# Patient Record
Sex: Male | Born: 1940 | Race: White | Hispanic: No | Marital: Married | Smoking: Former smoker
Health system: Southern US, Community
[De-identification: ages and names within clinical notes are randomized; demographics above are authoritative.]

## PROBLEM LIST (undated history)

## (undated) DIAGNOSIS — I10 Essential (primary) hypertension: Secondary | ICD-10-CM

## (undated) DIAGNOSIS — E119 Type 2 diabetes mellitus without complications: Secondary | ICD-10-CM

## (undated) DIAGNOSIS — J449 Chronic obstructive pulmonary disease, unspecified: Secondary | ICD-10-CM

## (undated) DIAGNOSIS — E785 Hyperlipidemia, unspecified: Secondary | ICD-10-CM

## (undated) HISTORY — DX: Hyperlipidemia, unspecified: E78.5

## (undated) HISTORY — DX: Type 2 diabetes mellitus without complications: E11.9

## (undated) HISTORY — PX: CORONARY ARTERY BYPASS GRAFT: SHX141

## (undated) HISTORY — DX: Essential (primary) hypertension: I10

## (undated) HISTORY — DX: Chronic obstructive pulmonary disease, unspecified: J44.9

---

## 2014-05-01 ENCOUNTER — Other Ambulatory Visit: Payer: Self-pay | Admitting: Family Medicine

## 2014-05-01 ENCOUNTER — Ambulatory Visit
Admission: RE | Admit: 2014-05-01 | Discharge: 2014-05-01 | Disposition: A | Discharge status: Home / Self Care | Payer: Medicare Other | Source: Ambulatory Visit | Attending: Family Medicine | Admitting: Family Medicine

## 2014-05-01 DIAGNOSIS — M545 Low back pain: Secondary | ICD-10-CM

## 2014-05-01 DIAGNOSIS — R0609 Other forms of dyspnea: Principal | ICD-10-CM

## 2014-05-01 DIAGNOSIS — M79606 Pain in leg, unspecified: Secondary | ICD-10-CM

## 2014-08-15 DIAGNOSIS — I2510 Atherosclerotic heart disease of native coronary artery without angina pectoris: Secondary | ICD-10-CM | POA: Insufficient documentation

## 2014-08-15 DIAGNOSIS — Z951 Presence of aortocoronary bypass graft: Secondary | ICD-10-CM | POA: Insufficient documentation

## 2015-03-18 ENCOUNTER — Encounter: Payer: Medicare Other | Attending: Family Medicine | Admitting: Dietician

## 2015-03-18 ENCOUNTER — Encounter: Payer: Self-pay | Admitting: Dietician

## 2015-03-18 VITALS — Ht 68.0 in | Wt 179.0 lb

## 2015-03-18 DIAGNOSIS — E119 Type 2 diabetes mellitus without complications: Secondary | ICD-10-CM | POA: Insufficient documentation

## 2015-03-18 DIAGNOSIS — Z7984 Long term (current) use of oral hypoglycemic drugs: Secondary | ICD-10-CM | POA: Diagnosis not present

## 2015-03-18 DIAGNOSIS — I10 Essential (primary) hypertension: Secondary | ICD-10-CM | POA: Insufficient documentation

## 2015-03-18 DIAGNOSIS — E118 Type 2 diabetes mellitus with unspecified complications: Secondary | ICD-10-CM

## 2015-03-18 NOTE — Patient Instructions (Signed)
Plan:  Aim for 3 Carb Choices per meal (45 grams) +/- 1 either way  Aim for 0-1 Carbs per snack if hungry  Include protein in moderation with your meals and snacks Consider reading food labels for Total Carbohydrate and Fat Grams of foods Consider  increasing your activity level by walking for 10 minutes daily as tolerated Consider checking BG at alternate times per day as directed by MD  Consider taking medication as directed by MD Avoid eating when not hungry. Avoid eating in front of the TV. Put the portions for a snack in a bowl/plate, sit down, and enjoy. Great job reducing your portions!

## 2015-03-18 NOTE — Addendum Note (Signed)
Addended by: Bonnita Levan on: 03/18/2015 05:01 PM   Modules accepted: Medications

## 2015-03-18 NOTE — Progress Notes (Signed)
Diabetes Self-Management Education  Visit Type: First/Initial  Appt. Start Time: 1045 Appt. End Time: 1215  03/18/2015  [name], identified by name and date of birth, is a 75 y.o. male with a diagnosis of Diabetes: Type 2. Other hx includes COPD, HTN, CABG and iron deficient anemia of unknown cause.  Patient also reports taking increased amounts of tums.  He has refused colonoscopy and endoscopy at this time.  Discussed briefly.  He also reports depression.  He reports decreased energy as his main complaint and has little interest in doing things.  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  He states that the job was very stressful.  Wife does the shopping and cooking.  He does not enjoy exercise but will care for his own lawn.  ASSESSMENT  Height  (1.727 m), weight 179 lb (81.194 kg). Body mass index is 27.22 kg/(m^2).      Diabetes Self-Management Education - 03/18/15 1106    Visit Information   Visit Type First/Initial   Initial Visit   Diabetes Type Type 2   Are you currently following a meal plan? No   Are you taking your medications as prescribed? Yes   Date Diagnosed 2002   Health Coping   How would you rate your overall health? Good   Psychosocial Assessment   Patient Belief/Attitude about Diabetes Other (comment)  "I hate taking the pills all the time."   Self-care barriers None   Self-management support Doctor's office;Family   Other persons present Patient;Spouse/SO   Patient Concerns Nutrition/Meal planning;Glycemic Control;Weight Control   Special Needs None   Preferred Learning Style No preference indicated   Learning Readiness Contemplating   How often do you need to have someone help you when you read instructions, pamphlets, or other written materials from your doctor or pharmacy? 1 - Never   What is the last grade level you completed in school? 4 years college   Complications   Last HgB A1C per  patient/outside source 8.3 %  01/2015   How often do you check your blood sugar? 1-2 times/day   Fasting Blood glucose range (mg/dL) 16-109;604-540   Postprandial Blood glucose range (mg/dL) >981   Number of hypoglycemic episodes per month 0   Number of hyperglycemic episodes per week 4   Can you tell when your blood sugar is high? Yes   What do you do if your blood sugar is high? don't   Have you had a dilated eye exam in the past 12 months? No   Have you had a dental exam in the past 12 months? No   Are you checking your feet? Yes   How many days per week are you checking your feet? 4   Dietary Intake   Breakfast plain Cheerios with splenda and 2% milk OR scrambled eggs, 2 strips bacon, english muffin with butter and jelly once per week  9   Snack (morning) occasional Lances crackers or home made crackers and cheese, or lightly salted peanuts, carrots and chip dip   Lunch apple, chicken salad or ham, crackers OR leftovers  snacks from 10-3   Snack (afternoon) chocolate, same as am and none after 3:00   Dinner meat, pasta or rice or potato and vegetable, salad with thousand [employer] (evening) none   Beverage(s) black coffee, water, occasional wine   Exercise   Exercise Type ADL's   How many days per week to you  exercise? 0   How many minutes per day do you exercise? 0   Total minutes per week of exercise 0   Patient Education   Previous Diabetes Education No   Disease state  Definition of diabetes, type 1 and 2, and the diagnosis of diabetes   Nutrition management  Role of diet in the treatment of diabetes and the relationship between the three main macronutrients and blood glucose level;Food label reading, portion sizes and measuring food.;Meal options for control of blood glucose level and chronic complications.   Physical activity and exercise  Role of exercise on diabetes management, blood pressure control and cardiac health.;Identified with patient nutritional and/or  medication changes necessary with exercise.;Helped patient identify appropriate exercises in relation to his/her diabetes, diabetes complications and other health issue.   Medications Reviewed patients medication for diabetes, action, purpose, timing of dose and side effects.   Monitoring Identified appropriate SMBG and/or A1C goals.;Daily foot exams;Yearly dilated eye exam   Acute complications Taught treatment of hypoglycemia - the 15 rule.   Chronic complications Relationship between chronic complications and blood glucose control;Dental care;Retinopathy and reason for yearly dilated eye exams   Psychosocial adjustment Worked with patient to identify barriers to care and solutions;Role of stress on diabetes   Individualized Goals (developed by patient)   Nutrition General guidelines for healthy choices and portions discussed   Physical Activity Exercise 3-5 times per week;15 minutes per day   Medications take my medication as prescribed   Monitoring  test my blood glucose as discussed   Reducing Risk treat hypoglycemia with 15 grams of carbs if blood glucose less than /dL;do foot checks daily   Outcomes   Expected Outcomes Demonstrated interest in learning. Expect positive outcomes  but does not like making change    Future DMSE PRN   Program Status Completed      Individualized Plan for Diabetes Self-Management Training:   Learning Objective:  Patient will have a greater understanding of diabetes self-management. Patient education plan is to attend individual and/or group sessions per assessed needs and concerns.  Also discussed GERD and foods to avoid to try to improve symptoms.  Plan:   Patient Instructions  Plan:  Aim for 3 Carb Choices per meal (45 grams) +/- 1 either way  Aim for 0-1 Carbs per snack if hungry  Include protein in moderation with your meals and snacks Consider reading food labels for Total Carbohydrate and Fat Grams of foods Consider  increasing your  activity level by walking for 10 minutes daily as tolerated Consider checking BG at alternate times per day as directed by MD  Consider taking medication as directed by MD Avoid eating when not hungry. Avoid eating in front of the TV. Put the portions for a snack in a bowl/plate, sit down, and enjoy. Great job reducing your portions!    Expected Outcomes:  Demonstrated interest in learning. Expect positive outcomes (but does not like making change )  Education material provided: Living Well with Diabetes, Food label handouts, Meal plan card, My Plate and Snack sheet, GERD handout from [facility]  If problems or questions, patient to contact team via:  Phone and Email  Future DSME appointment: PRN

## 2016-09-08 DIAGNOSIS — H35371 Puckering of macula, right eye: Secondary | ICD-10-CM | POA: Insufficient documentation

## 2016-09-08 DIAGNOSIS — E119 Type 2 diabetes mellitus without complications: Secondary | ICD-10-CM | POA: Insufficient documentation

## 2016-09-08 DIAGNOSIS — Z961 Presence of intraocular lens: Secondary | ICD-10-CM | POA: Insufficient documentation

## 2016-12-18 DIAGNOSIS — E789 Disorder of lipoprotein metabolism, unspecified: Secondary | ICD-10-CM | POA: Insufficient documentation

## 2017-03-15 DIAGNOSIS — H2512 Age-related nuclear cataract, left eye: Secondary | ICD-10-CM | POA: Insufficient documentation

## 2017-03-15 DIAGNOSIS — H5203 Hypermetropia, bilateral: Secondary | ICD-10-CM | POA: Insufficient documentation

## 2017-12-21 DIAGNOSIS — J449 Chronic obstructive pulmonary disease, unspecified: Secondary | ICD-10-CM | POA: Insufficient documentation

## 2017-12-21 DIAGNOSIS — I10 Essential (primary) hypertension: Secondary | ICD-10-CM | POA: Insufficient documentation

## 2018-07-01 DIAGNOSIS — R0609 Other forms of dyspnea: Secondary | ICD-10-CM | POA: Insufficient documentation

## 2020-04-05 ENCOUNTER — Ambulatory Visit
Admission: RE | Admit: 2020-04-05 | Discharge: 2020-04-05 | Disposition: A | Discharge status: Home / Self Care | Payer: Medicare PPO | Source: Ambulatory Visit | Attending: Family Medicine | Admitting: Family Medicine

## 2020-04-05 ENCOUNTER — Other Ambulatory Visit: Payer: Self-pay | Admitting: Family Medicine

## 2020-04-05 ENCOUNTER — Other Ambulatory Visit: Payer: Self-pay

## 2020-04-05 DIAGNOSIS — R062 Wheezing: Secondary | ICD-10-CM

## 2020-04-05 DIAGNOSIS — R0602 Shortness of breath: Secondary | ICD-10-CM

## 2020-06-11 ENCOUNTER — Other Ambulatory Visit: Payer: Self-pay | Admitting: *Deleted

## 2020-06-11 ENCOUNTER — Encounter: Payer: Self-pay | Admitting: *Deleted

## 2020-06-11 DIAGNOSIS — I472 Ventricular tachycardia, unspecified: Secondary | ICD-10-CM

## 2020-06-11 DIAGNOSIS — I2510 Atherosclerotic heart disease of native coronary artery without angina pectoris: Secondary | ICD-10-CM

## 2020-06-16 ENCOUNTER — Ambulatory Visit (INDEPENDENT_AMBULATORY_CARE_PROVIDER_SITE_OTHER): Payer: Medicare PPO

## 2020-06-16 DIAGNOSIS — I2510 Atherosclerotic heart disease of native coronary artery without angina pectoris: Secondary | ICD-10-CM

## 2020-06-16 DIAGNOSIS — I472 Ventricular tachycardia, unspecified: Secondary | ICD-10-CM

## 2020-12-17 ENCOUNTER — Encounter (HOSPITAL_BASED_OUTPATIENT_CLINIC_OR_DEPARTMENT_OTHER): Payer: Self-pay | Admitting: Emergency Medicine

## 2020-12-17 ENCOUNTER — Other Ambulatory Visit: Payer: Self-pay

## 2020-12-17 ENCOUNTER — Emergency Department (HOSPITAL_BASED_OUTPATIENT_CLINIC_OR_DEPARTMENT_OTHER)
Admission: EM | Admit: 2020-12-17 | Discharge: 2020-12-17 | Disposition: A | Discharge status: Home / Self Care | Payer: Medicare PPO | Attending: Emergency Medicine | Admitting: Emergency Medicine

## 2020-12-17 ENCOUNTER — Emergency Department (HOSPITAL_BASED_OUTPATIENT_CLINIC_OR_DEPARTMENT_OTHER): Payer: Medicare PPO

## 2020-12-17 DIAGNOSIS — Z79899 Other long term (current) drug therapy: Secondary | ICD-10-CM | POA: Insufficient documentation

## 2020-12-17 DIAGNOSIS — S0003XA Contusion of scalp, initial encounter: Secondary | ICD-10-CM | POA: Insufficient documentation

## 2020-12-17 DIAGNOSIS — I10 Essential (primary) hypertension: Secondary | ICD-10-CM | POA: Insufficient documentation

## 2020-12-17 DIAGNOSIS — W01198A Fall on same level from slipping, tripping and stumbling with subsequent striking against other object, initial encounter: Secondary | ICD-10-CM | POA: Insufficient documentation

## 2020-12-17 DIAGNOSIS — S0990XA Unspecified injury of head, initial encounter: Secondary | ICD-10-CM | POA: Diagnosis present

## 2020-12-17 DIAGNOSIS — Z7984 Long term (current) use of oral hypoglycemic drugs: Secondary | ICD-10-CM | POA: Diagnosis not present

## 2020-12-17 DIAGNOSIS — J449 Chronic obstructive pulmonary disease, unspecified: Secondary | ICD-10-CM | POA: Diagnosis not present

## 2020-12-17 DIAGNOSIS — I480 Paroxysmal atrial fibrillation: Secondary | ICD-10-CM | POA: Diagnosis not present

## 2020-12-17 DIAGNOSIS — Z951 Presence of aortocoronary bypass graft: Secondary | ICD-10-CM | POA: Insufficient documentation

## 2020-12-17 DIAGNOSIS — E119 Type 2 diabetes mellitus without complications: Secondary | ICD-10-CM | POA: Insufficient documentation

## 2020-12-17 DIAGNOSIS — W19XXXA Unspecified fall, initial encounter: Secondary | ICD-10-CM

## 2020-12-17 DIAGNOSIS — Z7901 Long term (current) use of anticoagulants: Secondary | ICD-10-CM | POA: Insufficient documentation

## 2020-12-17 NOTE — ED Provider Notes (Signed)
[facility] EMERGENCY DEPARTMENT Provider Note   CSN: [id number] Arrival date & time: 12/17/20  1638     History Chief Complaint  Patient presents with   Marletta Lor    Keny Donald is a 80 y.o. male.  HPI     80yo male with history of SVT, CAD, htn, paroxysmal atrial fibrillation on eliquis, DM, CABG who presents with concern for fall and head injury.  Was trying to get something out of the car and then turned and lost balance falling from standing and hitting the back of his head.  Denies LOC. No significanat headache. No n/v/diarrhea, black or bloody stools, cough, fever, numbness/weakness.  Came to ED given fall on eliquis.  Denies other acute concerns or injuries from the fall.   Past Medical History:  Diagnosis Date   COPD (chronic obstructive pulmonary disease) (HCC)    Diabetes mellitus without complication (HCC)    Hyperlipidemia    Hypertension     There are no problems to display for this patient.   Past Surgical History:  Procedure Laterality Date   CORONARY ARTERY BYPASS GRAFT         No family history on file.     Home Medications Prior to Admission medications   Medication Sig Start Date End Date Taking? Authorizing Provider  atorvastatin (LIPITOR) 40 MG tablet Take 40 mg by mouth daily.    [provider]  carvedilol (COREG) 25 MG tablet Take 25 mg by mouth 2 (two) times daily with a meal. 1/2 twice per day    [provider]  ferrous sulfate 325 (65 FE) MG tablet Take 325 mg by mouth daily with breakfast.    [provider]  glipiZIDE (GLUCOTROL) 10 MG tablet Take 10 mg by mouth 3 (three) times daily before meals.    [provider]  metFORMIN (GLUCOPHAGE-XR) 500 MG 24 hr tablet Take 500 mg by mouth 2 (two) times daily with a meal.    [provider]  sitaGLIPtin (JANUVIA) 50 MG tablet Take 25 mg by mouth daily.    [provider]  torsemide (DEMADEX) 5 MG tablet Take 5 mg by mouth daily.     [provider]    Allergies    Patient has no known allergies.  Review of Systems   Review of Systems  Constitutional:  Negative for fever.  Eyes:  Negative for visual disturbance.  Respiratory:  Negative for cough and shortness of breath.   Cardiovascular:  Negative for chest pain.  Gastrointestinal:  Negative for abdominal pain, nausea and vomiting.  Genitourinary:  Negative for difficulty urinating.  Musculoskeletal:  Negative for back pain and neck stiffness.  Skin:  Negative for rash.  Neurological:  Negative for syncope and headaches.   Physical Exam Updated Vital Signs BP (!) 180/85   Pulse 69   Temp 98.2 F (36.8 C) (Oral)   Resp 17   Ht 5\' 7"  (1.702 m)   Wt 72.6 kg   SpO2 97%   BMI 25.06 kg/m   Physical Exam Vitals and nursing note reviewed.  Constitutional:      General: He is not in acute distress.    Appearance: He is well-developed. He is not diaphoretic.  HENT:     Head: Normocephalic.     Comments: Occipital hematoma, small abrasion Eyes:     Conjunctiva/sclera: Conjunctivae normal.  Cardiovascular:     Rate and Rhythm: Normal rate and regular rhythm.     Heart sounds: Normal heart  sounds. No murmur heard.   No friction rub. No gallop.  Pulmonary:     Effort: Pulmonary effort is normal. No respiratory distress.     Breath sounds: Normal breath sounds. No wheezing or rales.  Abdominal:     General: There is no distension.     Palpations: Abdomen is soft.     Tenderness: There is no abdominal tenderness. There is no guarding.  Musculoskeletal:        General: No tenderness.     Cervical back: Normal range of motion.  Skin:    General: Skin is warm and dry.  Neurological:     Mental Status: He is alert and oriented to person, place, and time.    ED Results / Procedures / Treatments   Labs (all labs ordered are listed, but only abnormal results are displayed) Labs Reviewed - No data to display  EKG None  Radiology CT Head Wo  Contrast  Result Date: 12/17/2020 CLINICAL DATA:  Head trauma EXAM: CT HEAD WITHOUT CONTRAST TECHNIQUE: Contiguous axial images were obtained from the base of the skull through the vertex without intravenous contrast. COMPARISON:  None available FINDINGS: Brain: No acute intracranial hemorrhage, mass effect, or herniation. No extra-axial fluid collections. No evidence of acute territorial infarct. No hydrocephalus. Moderate cortical volume loss. Patchy hypodensities in the periventricular and subcortical white matter, likely secondary to chronic microvascular ischemic changes. 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 Vascular: Calcified plaques in the carotid siphons and distal vertebral arteries Skull: Normal. Negative for fracture or focal lesion. Sinuses/Orbits: No acute finding. Other: None. IMPRESSION: Chronic changes as described with no acute intracranial process identified. Electronically Signed   By: Ofilia Neas M.D.   On: 12/17/2020 18:11   CT Cervical Spine Wo Contrast  Result Date: 12/17/2020 CLINICAL DATA:  Head trauma EXAM: CT CERVICAL SPINE WITHOUT CONTRAST TECHNIQUE: Multidetector CT imaging of the cervical spine was performed without intravenous contrast. Multiplanar CT image reconstructions were also generated. COMPARISON:  None. FINDINGS: Alignment: Minimal grade 1 anterolisthesis of C2 on C3 and C3 on C4, likely chronic. Skull base and vertebrae: No acute fracture. No primary bone lesion or focal pathologic process. Soft tissues and spinal canal: No prevertebral fluid or swelling. No visible canal hematoma. Disc levels: Moderate intervertebral disc height loss at C4-C5, C5-C6 and C6-C7 with associated endplate sclerosis. Multilevel uncovertebral spurring and dorsal endplate osteophytes most prominent at C4-C5 and C5-C6. Facet arthropathy throughout the cervical spine. Multilevel neural foraminal narrowing most significant at C4-C5 and C5-C6. Mild  spinal canal stenoses. Upper chest: No acute process identified. Other: None. IMPRESSION: No acute fracture or subluxation identified.  Degenerative changes. Electronically Signed   By: Ofilia Neas M.D.   On: 12/17/2020 18:09    Procedures Procedures   Medications Ordered in ED Medications - No data to display  ED Course  I have reviewed the triage vital signs and the nursing notes.  Pertinent labs & imaging results that were available during my care of the patient were reviewed by me and considered in my medical decision making (see chart for details).    MDM Rules/Calculators/A&P                            80yo male with history of SVT, CAD, htn, paroxysmal atrial fibrillation on eliquis, DM, CABG who presents with concern for fall and head injury.  Mechanical fall by history with no dizziness,  syncope or acute medical concerns. Denies concerns for other injuries. Given mechanism, age, signs of trauma, CT cervical spine also performed without acute findings. CT head done given on anticoagulation shows no evidence of intrcranial bleeding. Recommend continued monitoring, PCP follow up as needed. Patient discharged in stable condition with understanding of reasons to return.   Final Clinical Impression(s) / ED Diagnoses Final diagnoses:  Fall, initial encounter  Hematoma of occipital region of scalp    Rx / DC Orders ED Discharge Orders     None        Gareth Morgan, MD 12/18/20 1213

## 2020-12-17 NOTE — ED Triage Notes (Signed)
Fell 1 hour ago , from standing posiy=tion backward, landed on the concrete , occipital hematoma , skin abrasion . No bleeding noted. On Elaquis . Alert and oriented x 4 .

## 2021-01-24 ENCOUNTER — Other Ambulatory Visit (HOSPITAL_BASED_OUTPATIENT_CLINIC_OR_DEPARTMENT_OTHER): Payer: Self-pay | Admitting: Family Medicine

## 2021-01-24 DIAGNOSIS — R1110 Vomiting, unspecified: Secondary | ICD-10-CM

## 2021-01-30 ENCOUNTER — Other Ambulatory Visit: Payer: Self-pay

## 2021-01-30 ENCOUNTER — Ambulatory Visit (HOSPITAL_BASED_OUTPATIENT_CLINIC_OR_DEPARTMENT_OTHER)
Admission: RE | Admit: 2021-01-30 | Discharge: 2021-01-30 | Disposition: A | Discharge status: Home / Self Care | Payer: Medicare PPO | Source: Ambulatory Visit | Attending: Family Medicine | Admitting: Family Medicine

## 2021-01-30 DIAGNOSIS — R1110 Vomiting, unspecified: Secondary | ICD-10-CM | POA: Insufficient documentation

## 2021-04-13 LAB — EXTERNAL GENERIC LAB PROCEDURE: COLOGUARD: NEGATIVE

## 2021-04-13 LAB — COLOGUARD: COLOGUARD: NEGATIVE

## 2021-05-19 ENCOUNTER — Emergency Department (HOSPITAL_COMMUNITY): Payer: Medicare PPO

## 2021-05-19 ENCOUNTER — Emergency Department (HOSPITAL_COMMUNITY)
Admission: EM | Admit: 2021-05-19 | Discharge: 2021-05-19 | Disposition: A | Discharge status: Home / Self Care | Payer: Medicare PPO | Attending: Emergency Medicine | Admitting: Emergency Medicine

## 2021-05-19 DIAGNOSIS — R112 Nausea with vomiting, unspecified: Secondary | ICD-10-CM | POA: Insufficient documentation

## 2021-05-19 DIAGNOSIS — W19XXXA Unspecified fall, initial encounter: Secondary | ICD-10-CM | POA: Insufficient documentation

## 2021-05-19 DIAGNOSIS — R42 Dizziness and giddiness: Secondary | ICD-10-CM | POA: Diagnosis not present

## 2021-05-19 DIAGNOSIS — Z23 Encounter for immunization: Secondary | ICD-10-CM | POA: Insufficient documentation

## 2021-05-19 DIAGNOSIS — S81012A Laceration without foreign body, left knee, initial encounter: Secondary | ICD-10-CM | POA: Insufficient documentation

## 2021-05-19 DIAGNOSIS — E871 Hypo-osmolality and hyponatremia: Secondary | ICD-10-CM | POA: Insufficient documentation

## 2021-05-19 DIAGNOSIS — S0031XA Abrasion of nose, initial encounter: Secondary | ICD-10-CM | POA: Insufficient documentation

## 2021-05-19 DIAGNOSIS — S0992XA Unspecified injury of nose, initial encounter: Secondary | ICD-10-CM | POA: Diagnosis present

## 2021-05-19 LAB — CBC WITH DIFFERENTIAL/PLATELET
Abs Immature Granulocytes: 0.02 10*3/uL (ref 0.00–0.07)
Basophils Absolute: 0 10*3/uL (ref 0.0–0.1)
Basophils Relative: 0 %
Eosinophils Absolute: 0.1 10*3/uL (ref 0.0–0.5)
Eosinophils Relative: 2 %
HCT: 34.9 % — ABNORMAL LOW (ref 39.0–52.0)
Hemoglobin: 11.6 g/dL — ABNORMAL LOW (ref 13.0–17.0)
Immature Granulocytes: 0 %
Lymphocytes Relative: 8 %
Lymphs Abs: 0.4 10*3/uL — ABNORMAL LOW (ref 0.7–4.0)
MCH: 32.2 pg (ref 26.0–34.0)
MCHC: 33.2 g/dL (ref 30.0–36.0)
MCV: 96.9 fL (ref 80.0–100.0)
Monocytes Absolute: 0.5 10*3/uL (ref 0.1–1.0)
Monocytes Relative: 10 %
Neutro Abs: 4.3 10*3/uL (ref 1.7–7.7)
Neutrophils Relative %: 80 %
Platelets: 147 10*3/uL — ABNORMAL LOW (ref 150–400)
RBC: 3.6 MIL/uL — ABNORMAL LOW (ref 4.22–5.81)
RDW: 14.4 % (ref 11.5–15.5)
WBC: 5.3 10*3/uL (ref 4.0–10.5)
nRBC: 0 % (ref 0.0–0.2)

## 2021-05-19 LAB — COMPREHENSIVE METABOLIC PANEL
ALT: 15 U/L (ref 0–44)
AST: 27 U/L (ref 15–41)
Albumin: 3.5 g/dL (ref 3.5–5.0)
Alkaline Phosphatase: 55 U/L (ref 38–126)
Anion gap: 9 (ref 5–15)
BUN: 19 mg/dL (ref 8–23)
CO2: 23 mmol/L (ref 22–32)
Calcium: 8.7 mg/dL — ABNORMAL LOW (ref 8.9–10.3)
Chloride: 101 mmol/L (ref 98–111)
Creatinine, Ser: 1.48 mg/dL — ABNORMAL HIGH (ref 0.61–1.24)
GFR, Estimated: 47 mL/min — ABNORMAL LOW (ref >60)
Glucose, Bld: 124 mg/dL — ABNORMAL HIGH (ref 70–99)
Potassium: 4 mmol/L (ref 3.5–5.1)
Sodium: 133 mmol/L — ABNORMAL LOW (ref 135–145)
Total Bilirubin: 1 mg/dL (ref 0.3–1.2)
Total Protein: 6.6 g/dL (ref 6.5–8.1)

## 2021-05-19 LAB — MAGNESIUM: Magnesium: 1.8 mg/dL (ref 1.7–2.4)

## 2021-05-19 MED ORDER — ONDANSETRON 4 MG PO TBDP
ORAL_TABLET | ORAL | 0 refills | Status: AC
Start: 2021-05-19 — End: ?

## 2021-05-19 MED ORDER — TETANUS-DIPHTH-ACELL PERTUSSIS 5-2.5-18.5 LF-MCG/0.5 IM SUSY
0.5000 mL | PREFILLED_SYRINGE | Freq: Once | INTRAMUSCULAR | Status: AC
  Administered 2021-05-19: 0.5 mL via INTRAMUSCULAR
  Filled 2021-05-19: qty 0.5

## 2021-05-19 MED ORDER — SODIUM CHLORIDE 0.9 % IV BOLUS
1000.0000 mL | Freq: Once | INTRAVENOUS | Status: AC
  Administered 2021-05-19: 1000 mL via INTRAVENOUS

## 2021-05-19 NOTE — Progress Notes (Signed)
Orthopedic Tech Progress Note ?Patient Details:  ?Terrance Usery ?[birth year] ?[id number] ? ?Patient ID: Micael Barb, male   DOB: 03/21/1940, 81 y.o.   MRN: [id number] ?Level II; not needed, ? ?Darleen Crocker ?05/19/2021, 8:55 PM ? ?

## 2021-05-19 NOTE — ED Provider Notes (Signed)
?[facility] EMERGENCY DEPARTMENT ?Provider Note ? ? ?CSN: [id number] ?Arrival date & time: 05/19/21  2051 ? ?  ? ?History ? ?Chief Complaint  ?Patient presents with  ? Fall  ? ? ?George Fitzpatrick is a 81 y.o. male. ? ?George Fitzpatrick with a chief complaint of a fall.  The patient tells me that he was going down a incline and he lost his balance and fell onto his face.  He did have some nausea and vomiting afterwards.  Reportedly has had some diarrhea over the past week and is felt a little bit lightheaded when he stands up.  He scraped up his left knee but denies any other significant injury.  Denies chest pain abdominal pain back pain. ? ? ?Fall ? ? ?  ? ?Home Medications ?Prior to Admission medications   ?Medication Sig Start Date End Date Taking? Authorizing Provider  ?ondansetron (ZOFRAN-ODT) 4 MG disintegrating tablet 4mg  ODT q4 hours prn nausea/vomit 05/19/21  Yes 07/19/21, DO  ?atorvastatin (LIPITOR) 40 MG tablet Take 40 mg by mouth daily.    [provider]  ?carvedilol (COREG) 25 MG tablet Take 25 mg by mouth 2 (two) times daily with a meal. 1/2 twice per day    [provider]  ?ferrous sulfate 325 (65 FE) MG tablet Take 325 mg by mouth daily with breakfast.    [provider]  ?glipiZIDE (GLUCOTROL) 10 MG tablet Take 10 mg by mouth 3 (three) times daily before meals.    [provider]  ?metFORMIN (GLUCOPHAGE-XR) 500 MG 24 hr tablet Take 500 mg by mouth 2 (two) times daily with a meal.    [provider]  ?sitaGLIPtin (JANUVIA) 50 MG tablet Take 25 mg by mouth daily.    [provider]  ?torsemide (DEMADEX) 5 MG tablet Take 5 mg by mouth daily.    [provider]  ?   ? ?Allergies    ?Patient has no known allergies.   ? ?Review of Systems   ?Review of Systems ? ?Physical Exam ?Updated Vital Signs ?BP (!) 196/76   Pulse 68   Temp 98.4 ?F (36.9 ?C)   Resp 13   SpO2 99%  ?Physical Exam ?Vitals and nursing note reviewed.  ?Constitutional:    ?   Appearance: He is well-developed.  ?HENT:  ?   Head: Normocephalic.  ?   Comments: Abrasion to the bridge of the nose. ?Eyes:  ?   Pupils: Pupils are equal, round, and reactive to light.  ?Neck:  ?   Vascular: No JVD.  ?Cardiovascular:  ?   Rate and Rhythm: Normal rate and regular rhythm.  ?   Heart sounds: No murmur heard. ?  No friction rub. No gallop.  ?Pulmonary:  ?   Effort: No respiratory distress.  ?   Breath sounds: No wheezing.  ?Abdominal:  ?   General: There is no distension.  ?   Tenderness: There is no abdominal tenderness. There is no guarding or rebound.  ?Musculoskeletal:     ?   General: Normal range of motion.  ?   Cervical back: Normal range of motion and neck supple.  ?   Comments: Very small skin tear to the left knee.  Full range of motion of the left knee without pain.  Palpated from head to toe without any other noted areas of bony tenderness.  ?Skin: ?   Coloration: Skin is not pale.  ?   Findings: No rash.  ?Neurological:  ?  Mental Status: He is alert and oriented to person, place, and time.  ?Psychiatric:     ?   Behavior: Behavior normal.  ? ? ?ED Results / Procedures / Treatments   ?Labs ?(all labs ordered are listed, but only abnormal results are displayed) ?Labs Reviewed  ?CBC WITH DIFFERENTIAL/PLATELET - Abnormal; Notable for the following components:  ?    Result Value  ? RBC 3.60 (*)   ? Hemoglobin 11.6 (*)   ? HCT 34.9 (*)   ? Platelets 147 (*)   ? Lymphs Abs 0.4 (*)   ? All other components within normal limits  ?COMPREHENSIVE METABOLIC PANEL - Abnormal; Notable for the following components:  ? Sodium 133 (*)   ? Glucose, Bld 124 (*)   ? Creatinine, Ser 1.48 (*)   ? Calcium 8.7 (*)   ? GFR, Estimated 47 (*)   ? All other components within normal limits  ?MAGNESIUM  ? ? ?EKG ?None ? ?Radiology ?CT Head Wo Contrast ? ?Result Date: 05/19/2021 ?CLINICAL DATA:  Recent fall while on blood thinners, initial encounter EXAM: CT HEAD WITHOUT CONTRAST TECHNIQUE: Contiguous axial images  were obtained from the base of the skull through the vertex without intravenous contrast. RADIATION DOSE REDUCTION: This exam was performed according to the departmental dose-optimization program which includes automated exposure control, adjustment of the mA and/or kV according to patient size and/or use of iterative reconstruction technique. COMPARISON:  08/16/2020 FINDINGS: Brain: Mild atrophic changes and chronic white matter ischemic changes are seen similar to that noted on the prior exam. No acute hemorrhage, acute infarction or space-occupying mass lesion is noted. Vascular: No hyperdense vessel or unexpected calcification. Skull: Normal. Negative for fracture or focal lesion. Sinuses/Orbits: No acute finding. Other: None. IMPRESSION: Chronic atrophic and ischemic changes without acute abnormality. Electronically Signed   By: Alcide CleverMark  Lukens Fitzpatrick.D.   On: 05/19/2021 21:24   ? ?Procedures ?Procedures  ? ? ?Medications Ordered in ED ?Medications  ?sodium chloride 0.9 % bolus 1,000 mL (1,000 mLs Intravenous New Bag/Given 05/19/21 2126)  ?Tdap (BOOSTRIX) injection 0.5 mL (0.5 mLs Intramuscular Given 05/19/21 2124)  ? ? ?ED Course/ Medical Decision Making/ A&P ?  ?                        ?Medical Decision Making ?Amount and/or Complexity of Data Reviewed ?Labs: ordered. ?Radiology: ordered. ? ?Risk ?Prescription drug management. ? ? ?81 yo Fitzpatrick with a chief complaints of a fall.  He arrived as a level 2 trauma.  Patient had a nonsyncopal fall by history though had some complaints of possible dehydration and diarrhea so blood work was ordered.  He has a very trivial hyponatremia.  No significant electrolyte abnormality otherwise no significant anemia.  CT of the head is negative for acute intracranial abnormality.  I reviewed the patient's medical record and was unable to find history of tetanus will update today.  Will discharge home.  PCP follow-up. ? ?10:23 PM:  I have discussed the diagnosis/risks/treatment options with  the patient.  Evaluation and diagnostic testing in the emergency department does not suggest an emergent condition requiring admission or immediate intervention beyond what has been performed at this time.  They will follow up with  PCP. We also discussed returning to the ED immediately if new or worsening sx occur. We discussed the sx which are most concerning (e.g., sudden worsening pain, fever, inability to tolerate by mouth ) that necessitate immediate return. Medications administered to  the patient during their visit and any new prescriptions provided to the patient are listed below. ? ?Medications given during this visit ?Medications  ?sodium chloride 0.9 % bolus 1,000 mL (1,000 mLs Intravenous New Bag/Given 05/19/21 2126)  ?Tdap (BOOSTRIX) injection 0.5 mL (0.5 mLs Intramuscular Given 05/19/21 2124)  ? ? ? ?The patient appears reasonably screen and/or stabilized for discharge and I doubt any other medical condition or other [facility] requiring further screening, evaluation, or treatment in the ED at this time prior to discharge.  ? ? ? ? ? ? ? ? ?Final Clinical Impression(s) / ED Diagnoses ?Final diagnoses:  ?Fall, initial encounter  ?Nasal abrasion, initial encounter  ? ? ?Rx / DC Orders ?ED Discharge Orders   ? ?      Ordered  ?  ondansetron (ZOFRAN-ODT) 4 MG disintegrating tablet       ? 05/19/21 2221  ? ?  ?  ? ?  ? ? ?  ?Melene Plan, DO ?05/19/21 2223 ? ?

## 2021-05-19 NOTE — Discharge Instructions (Signed)
Follow up with your doctor in the office.  Return for worsening confusion, persistent vomiting ?

## 2021-05-19 NOTE — ED Triage Notes (Signed)
Pt presents as a level 2 trauma, fall on thinners. Arrives G[facility] 15, only obvious trauma is skin tear on nose.  ?

## 2021-05-19 NOTE — ED Notes (Signed)
Pt transported to CT by trauma nurse ?

## 2021-05-19 NOTE — ED Notes (Signed)
Trauma Response Nurse Documentation ? ? ?George Fitzpatrick is a 81 y.o. male arriving to [facility] ED via EMS ? ?On Eliquis (apixaban) daily for atrial fibrillation. Trauma was activated as a Level 2 by ED charge RN based on the following trauma criteria Elderly patients > 65 with head trauma on anti-coagulation (excluding ASA). Trauma RN at the bedside on patient arrival. Patient cleared for CT by Dr. Adela Lank EDP. Patient to CT with TRN. GCS 15. ? ?History  ? Past Medical History:  ?Diagnosis Date  ? COPD (chronic obstructive pulmonary disease) (HCC)   ? Diabetes mellitus without complication (HCC)   ? Hyperlipidemia   ? Hypertension   ?  ? Past Surgical History:  ?Procedure Laterality Date  ? CORONARY ARTERY BYPASS GRAFT    ?  ? ? ? ?Initial Focused Assessment (If applicable, or please see trauma documentation): ?Alert/oriented male presents via EMS, airway patent/unobstructed ?No uncontrolled hemorrhage, skin tear to nose and left knee with controlled bleeding ?IV established in the field, 20G L AC ?No c-collar in place, 4MG  zofran given PTA ? ?CT's Completed:   ?CT Head  ? ?Interventions:  ?Trauma lab draw ?No xrays per Dr. ?IV fluid bolus ?TDAP ? ?Plan for disposition:  ?Discharge home  ? ?Consults completed:  ?none  ? ?Event Summary: ?Patient arrives via EMS after a ground level fall onto dirt. Abrasion to nose and left knee. Bleeding controlled. No LOC, GCS 15. Reports briefly that he was unable to move, resolved independently. Vomiting PTA, 4MG  zofran given by EMS. Moves all four extremities on arrival and denies pain. Escorted to CT by myself. TDAP updated. D/C home. ? ?MTP Summary (If applicable): NA ? ?Bedside handoff with ED RN Mikayla.   ? ?Tekesha Almgren O Edahi Kroening  ?Trauma Response RN ? ?Please call TRN at [phone] for further assistance. ?  ?

## 2021-05-30 DIAGNOSIS — I4729 Other ventricular tachycardia: Secondary | ICD-10-CM | POA: Insufficient documentation

## 2022-05-12 ENCOUNTER — Other Ambulatory Visit (HOSPITAL_BASED_OUTPATIENT_CLINIC_OR_DEPARTMENT_OTHER): Payer: Self-pay

## 2022-05-12 MED ORDER — TRULICITY 1.5 MG/0.5ML ~~LOC~~ SOAJ
1.5000 mg | SUBCUTANEOUS | 3 refills | Status: DC
  Filled 2022-05-12: qty 2, 28d supply, fill #0
  Filled 2022-06-04 – 2022-06-05 (×2): qty 2, 28d supply, fill #1

## 2022-05-13 ENCOUNTER — Other Ambulatory Visit (HOSPITAL_BASED_OUTPATIENT_CLINIC_OR_DEPARTMENT_OTHER): Payer: Self-pay

## 2022-05-15 ENCOUNTER — Other Ambulatory Visit (HOSPITAL_BASED_OUTPATIENT_CLINIC_OR_DEPARTMENT_OTHER): Payer: Self-pay

## 2022-06-04 ENCOUNTER — Other Ambulatory Visit (HOSPITAL_BASED_OUTPATIENT_CLINIC_OR_DEPARTMENT_OTHER): Payer: Self-pay

## 2022-06-05 ENCOUNTER — Other Ambulatory Visit (HOSPITAL_BASED_OUTPATIENT_CLINIC_OR_DEPARTMENT_OTHER): Payer: Self-pay

## 2022-06-05 MED ORDER — FLUTICASONE-SALMETEROL 250-50 MCG/ACT IN AEPB
1.0000 | INHALATION_SPRAY | Freq: Two times a day (BID) | RESPIRATORY_TRACT | 3 refills | Status: AC
  Filled 2022-06-05 – 2022-06-11 (×3): qty 180, 90d supply, fill #0

## 2022-06-08 ENCOUNTER — Other Ambulatory Visit (HOSPITAL_BASED_OUTPATIENT_CLINIC_OR_DEPARTMENT_OTHER): Payer: Self-pay

## 2022-06-11 ENCOUNTER — Other Ambulatory Visit (HOSPITAL_BASED_OUTPATIENT_CLINIC_OR_DEPARTMENT_OTHER): Payer: Self-pay

## 2022-06-12 ENCOUNTER — Other Ambulatory Visit (HOSPITAL_BASED_OUTPATIENT_CLINIC_OR_DEPARTMENT_OTHER): Payer: Self-pay

## 2022-08-04 IMAGING — CT CT CERVICAL SPINE W/O CM
4 series · 15 of 33 positions shown, 18 images · non-contrast
Comparison: None.

CLINICAL DATA: Head trauma

EXAM:
CT CERVICAL SPINE WITHOUT CONTRAST
TECHNIQUE: Multidetector CT imaging of the cervical spine was performed without
intravenous contrast. Multiplanar CT image reconstructions were also
generated.

[Series 3: c_spine 2.0 i30s 3 · axial · 0.35mm/px · z∈[+1142,+1246]mm · 5 of 78 slices shown, 7 images]
[im 13/78  soft-tissue]
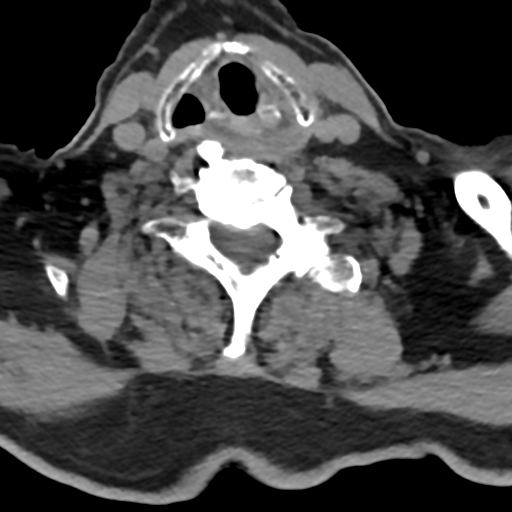
[im 13/78  bone]
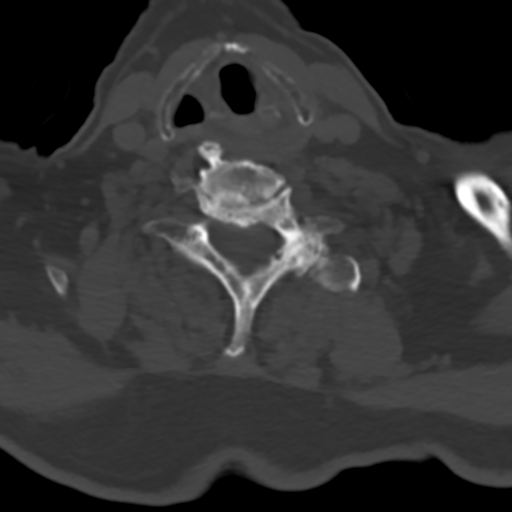
[im 26/78  bone]
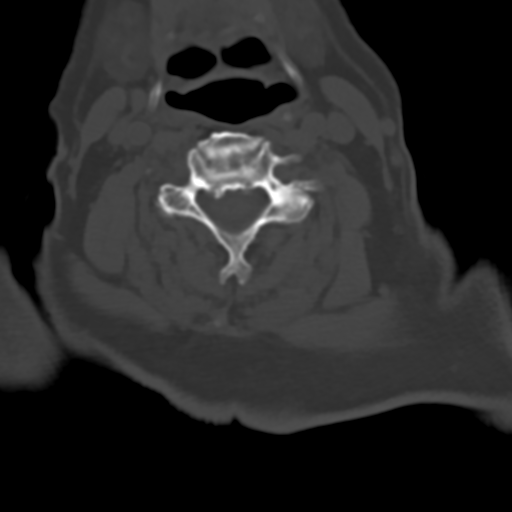
[im 39/78  bone]
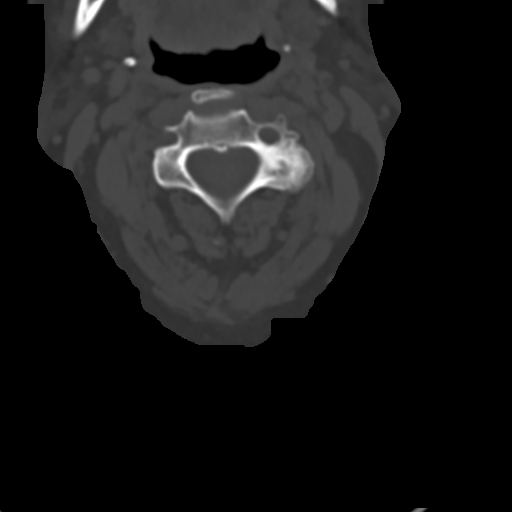
[im 52/78  bone]
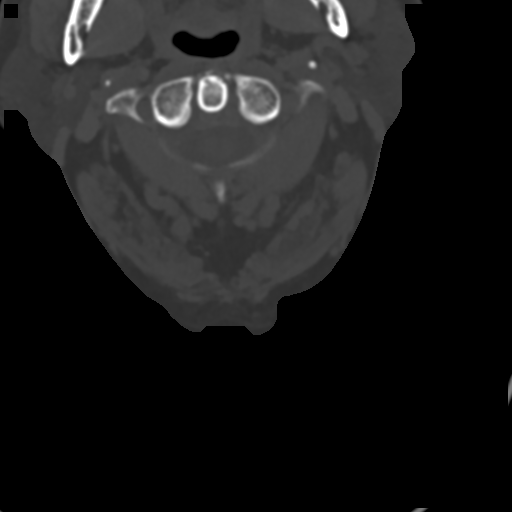
[im 65/78  soft-tissue]
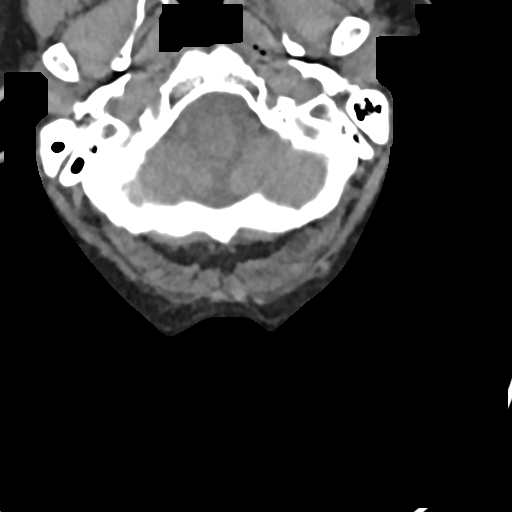
[im 65/78  bone]
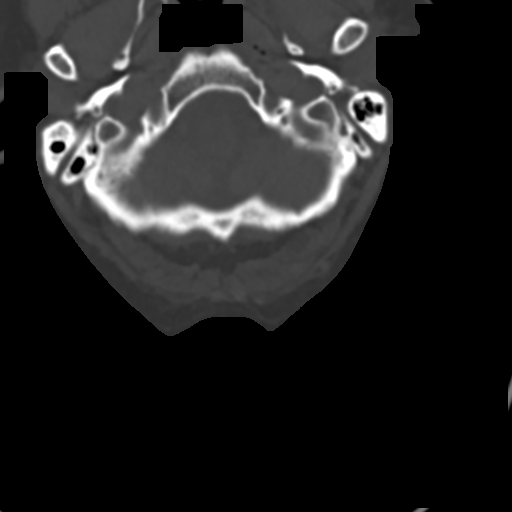

[Series 5: coronals · coronal · 0.23mm/px · 3 of 61 slices shown]
[im 14/61  bone]
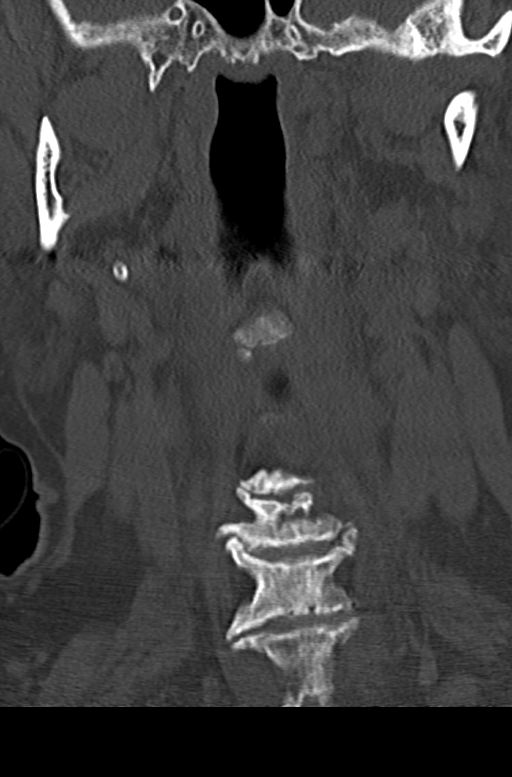
[im 25/61  bone]
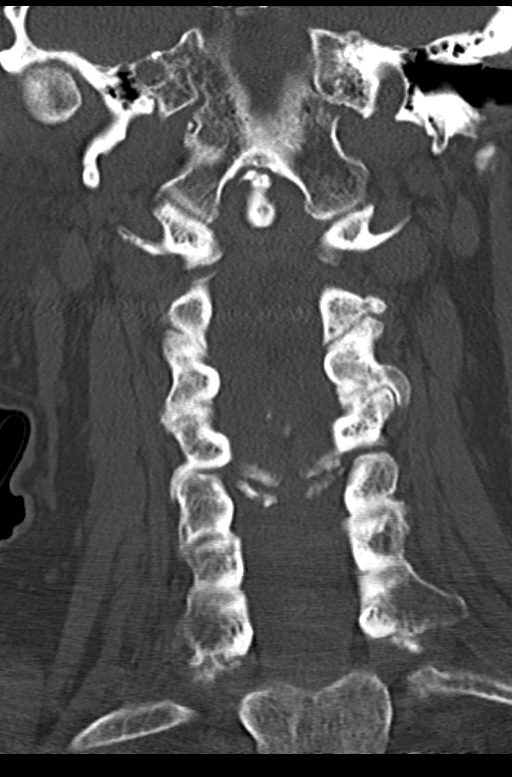
[im 36/61  bone]
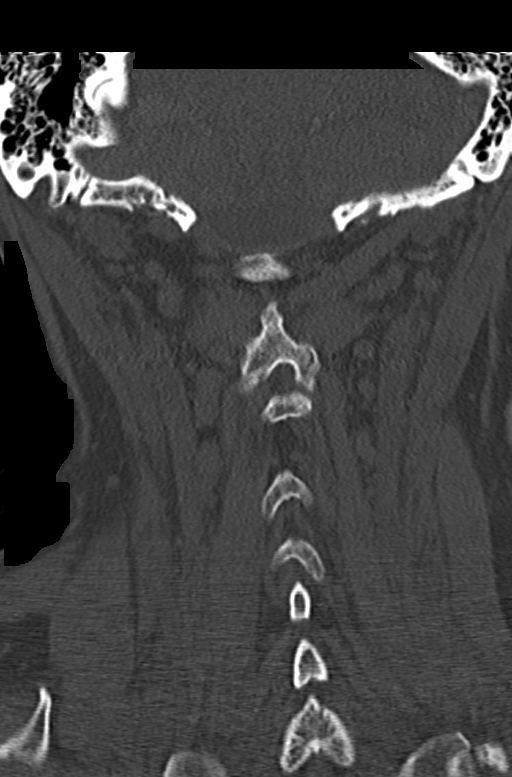

[Series 6: sagittals · sagittal · 0.29mm/px · 5 of 61 slices shown, 6 images]
[im 21/61  bone]
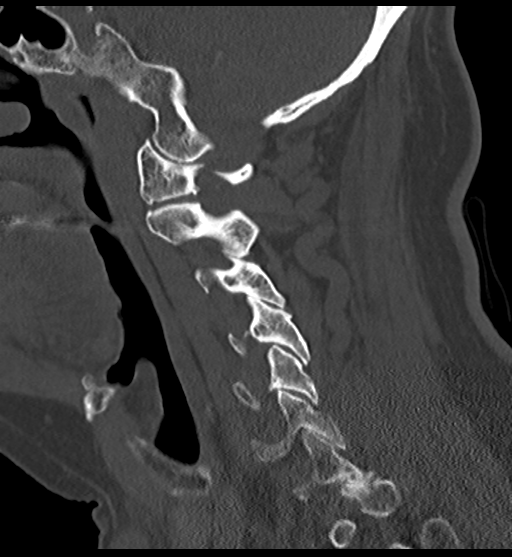
[im 26/61  bone]
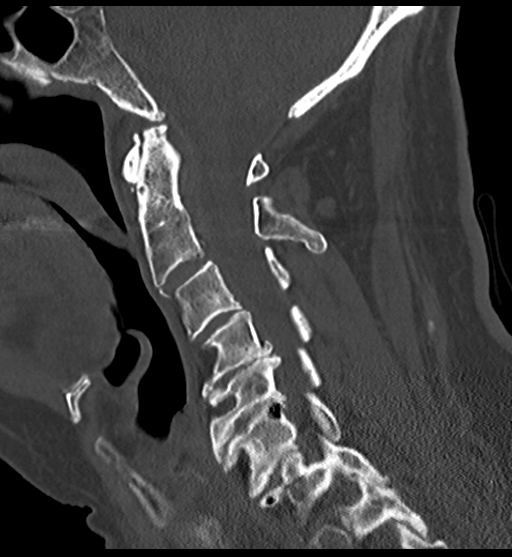
[im 31/61  soft-tissue]
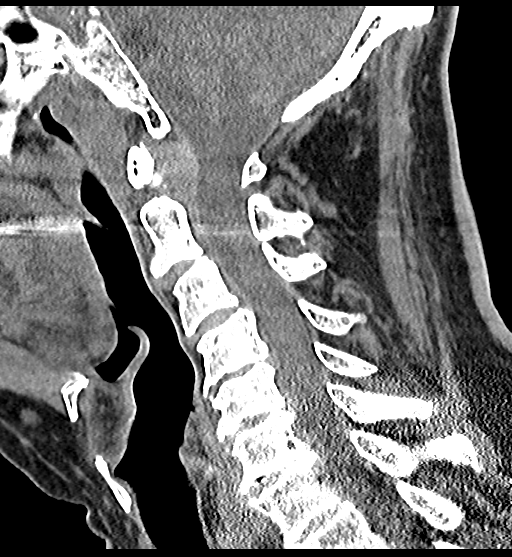
[im 31/61  bone]
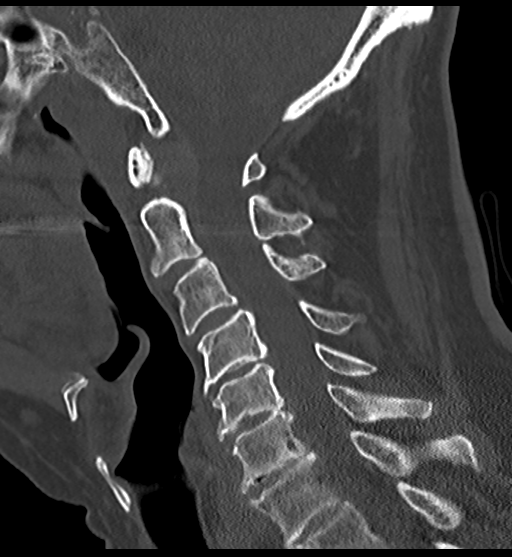
[im 36/61  bone]
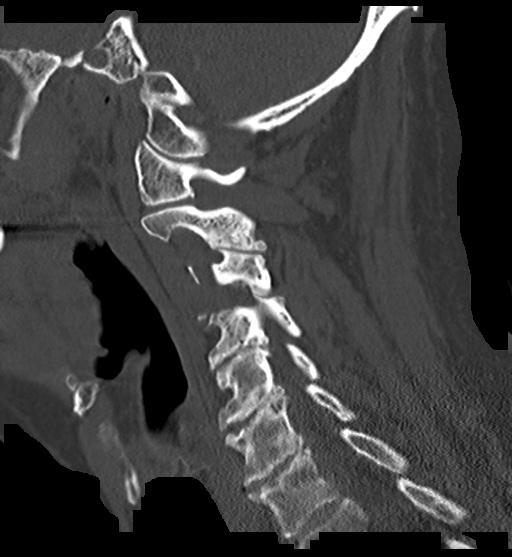
[im 41/61  bone]
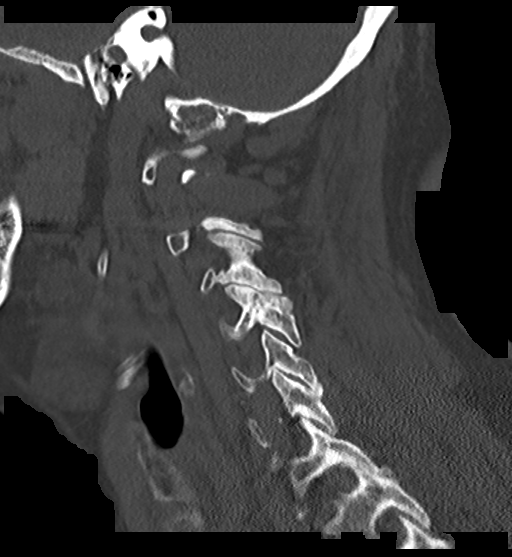

[Series 7: orthogonals · axial · 0.23mm/px · z∈[+1136,+1161]mm · 2 of 77 slices shown]
[im 13/77  bone]
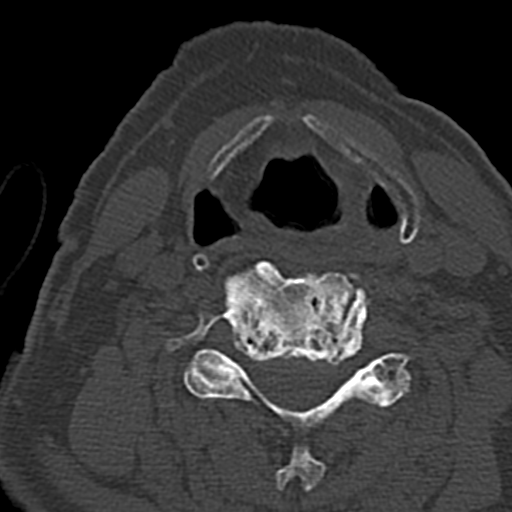
[im 26/77  bone]
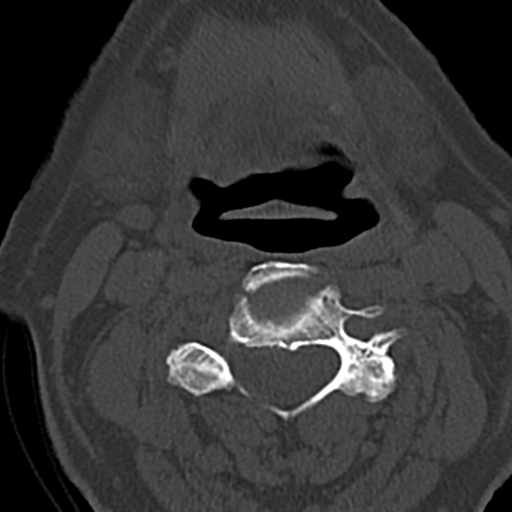

[15 of 33 positions shown; findings below may reference images not displayed]

FINDINGS: Alignment: Minimal grade 1 anterolisthesis of C2 on C3 and C3 on C4,
likely chronic.

Skull base and vertebrae: No acute fracture. No primary bone lesion
or focal pathologic process.

Soft tissues and spinal canal: No prevertebral fluid or swelling. No
visible canal hematoma.

Disc levels: Moderate intervertebral disc height loss at C4-C5,
C5-C6 and C6-C7 with associated endplate sclerosis. Multilevel
uncovertebral spurring and dorsal endplate osteophytes most
prominent at C4-C5 and C5-C6. Facet arthropathy throughout the
cervical spine. Multilevel neural foraminal narrowing most
significant at C4-C5 and C5-C6. Mild spinal canal stenoses.

Upper chest: No acute process identified.

Other: None.
IMPRESSION: No acute fracture or subluxation identified.  Degenerative changes.

## 2022-08-04 IMAGING — CT CT HEAD W/O CM
3 series · 14 of 47 positions shown, 16 images · non-contrast
Comparison: None available

CLINICAL DATA: Head trauma

EXAM:
CT HEAD WITHOUT CONTRAST
TECHNIQUE: Contiguous axial images were obtained from the base of the skull
through the vertex without intravenous contrast.

[Series 2: head 5.0 h30s · axial · 0.51mm/px · z∈[+1236,+1371]mm · 8 of 33 slices shown, 10 images]
[im 3/33  brain]
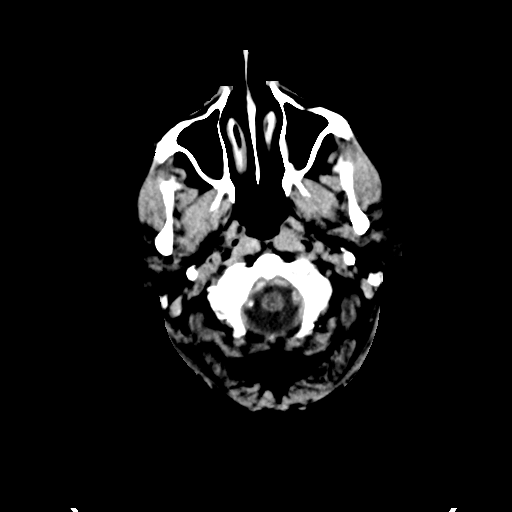
[im 3/33  bone]
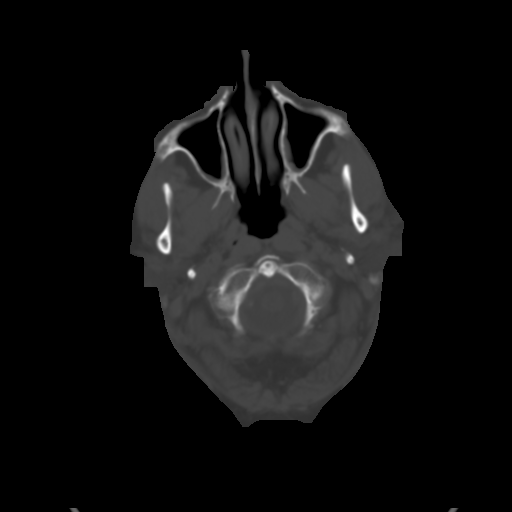
[im 7/33  brain]
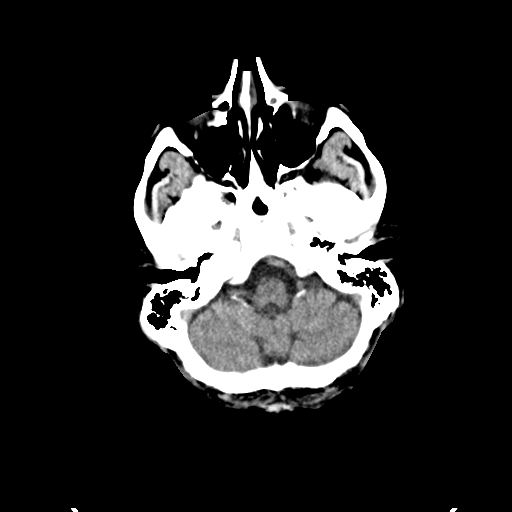
[im 10/33  brain]
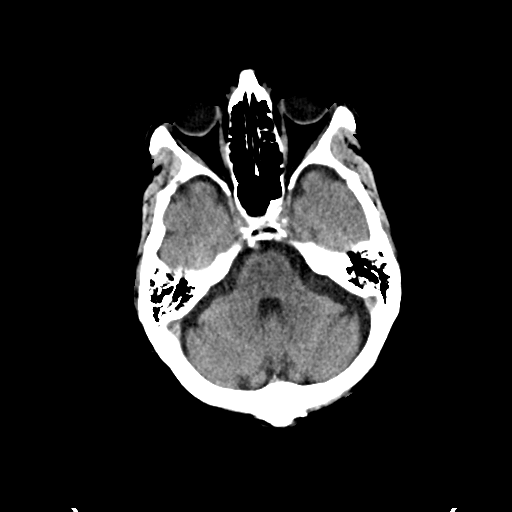
[im 15/33  brain]
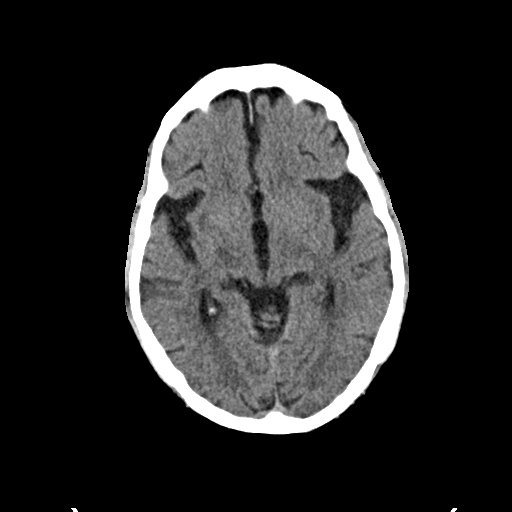
[im 18/33  brain]
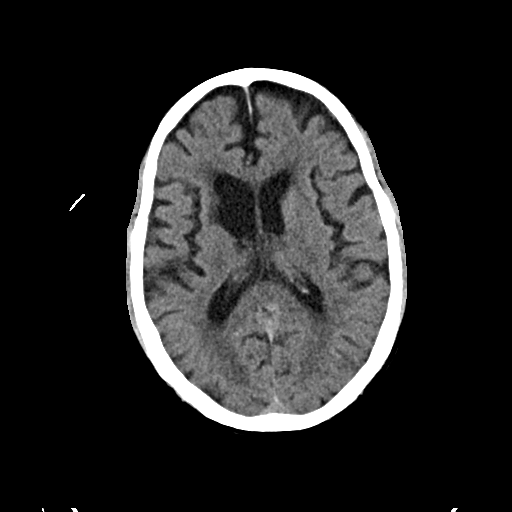
[im 18/33  bone]
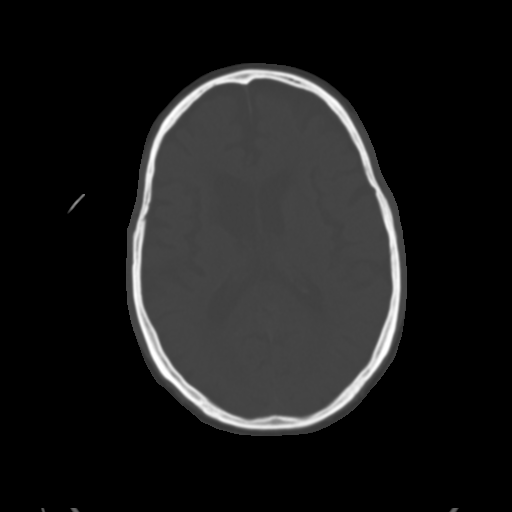
[im 23/33  brain]
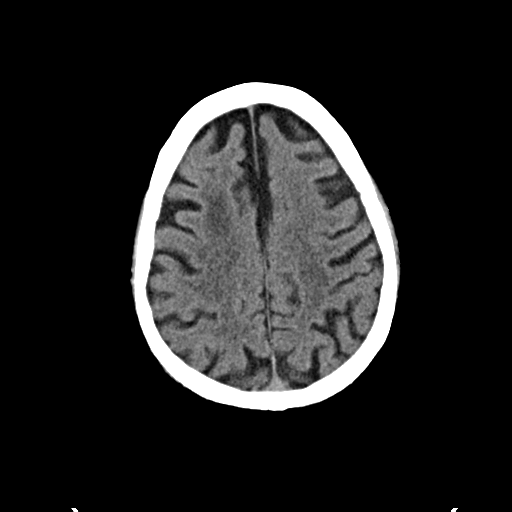
[im 26/33  brain]
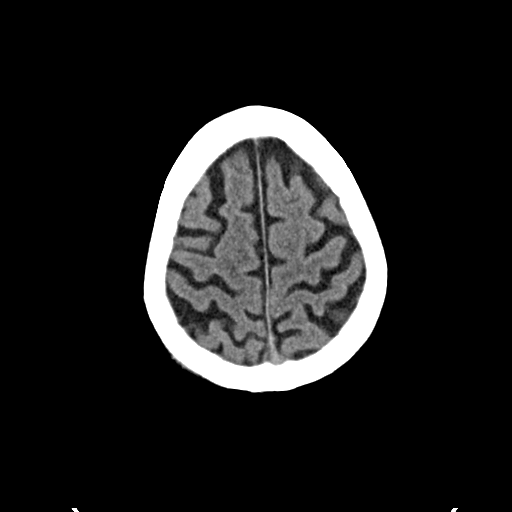
[im 30/33  brain]
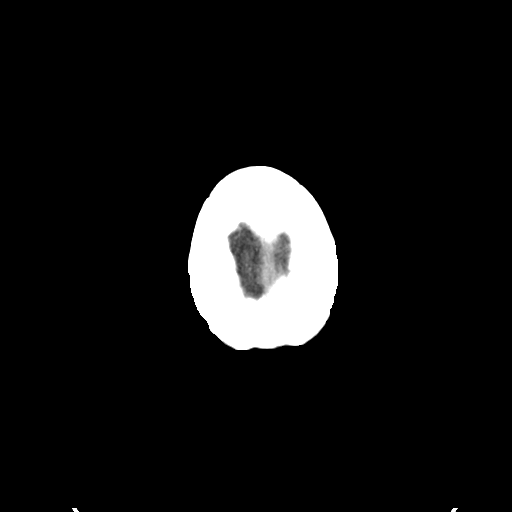

[Series 4: head 3.0 mpr cor · coronal · 0.33mm/px · 3 of 67 slices shown]
[im 23/67  brain]
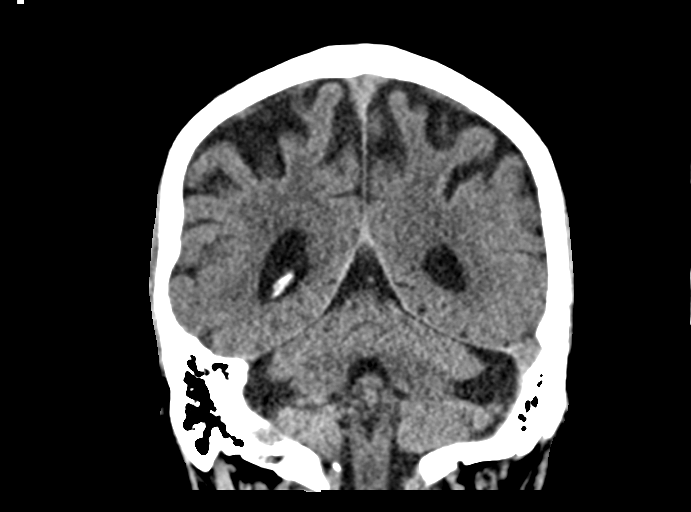
[im 30/67  brain]
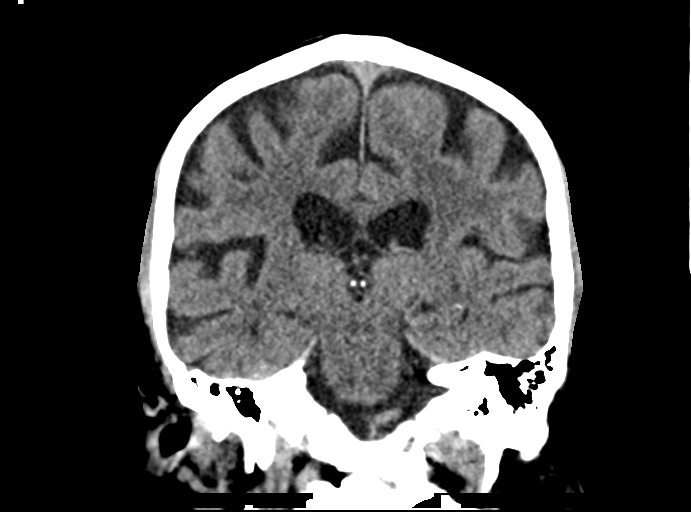
[im 37/67  brain]
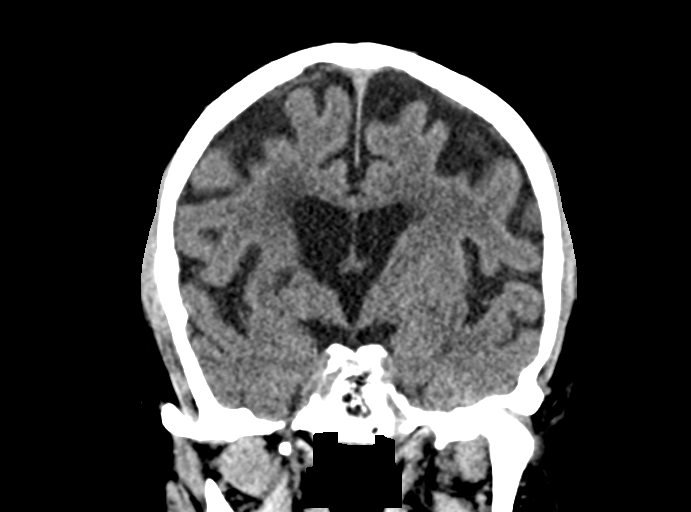

[Series 5: head 3.0 mpr sag · sagittal · 0.33mm/px · 3 of 66 slices shown]
[im 22/66  brain]
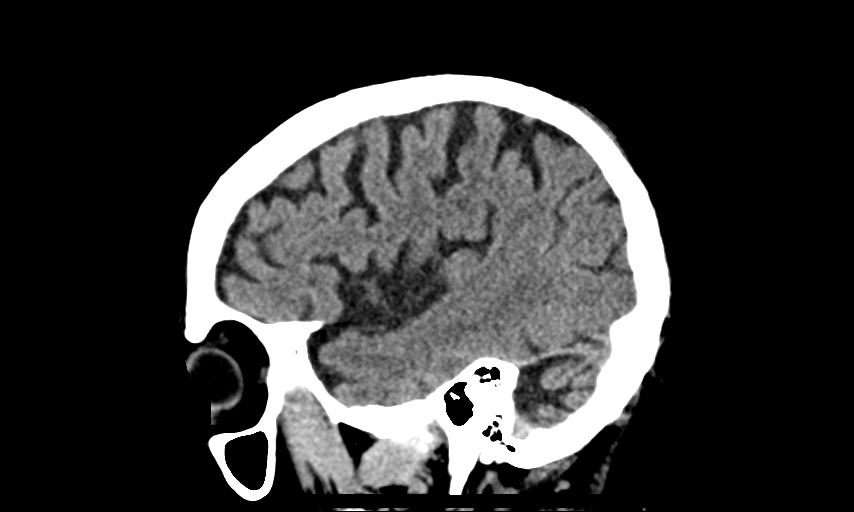
[im 33/66  brain]
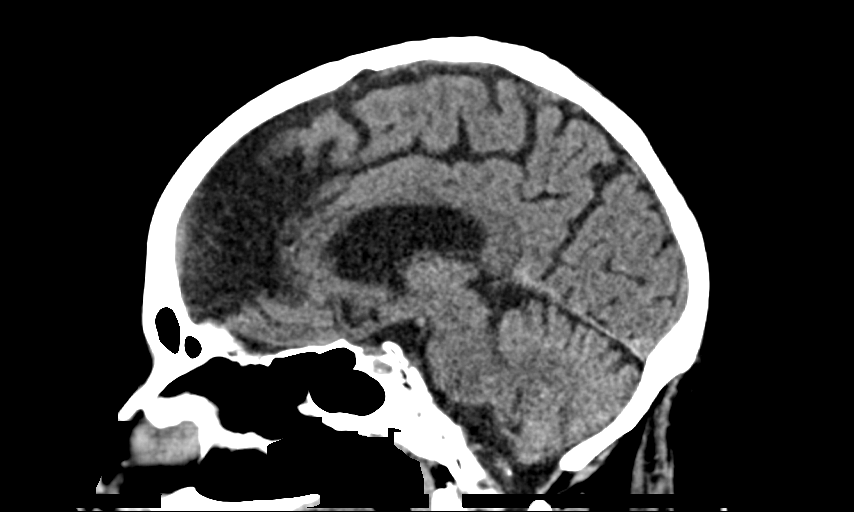
[im 44/66  brain]
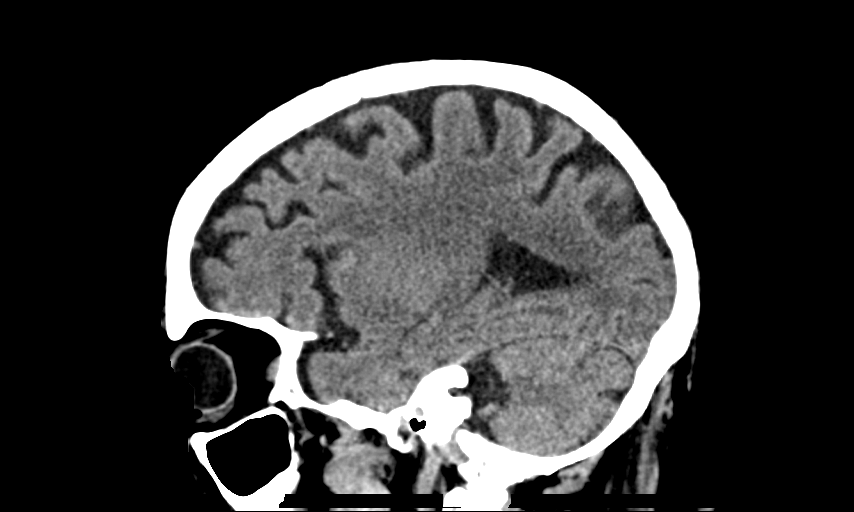

[14 of 47 positions shown; findings below may reference images not displayed]

FINDINGS: Brain: No acute intracranial hemorrhage, mass effect, or herniation.
No extra-axial fluid collections. No evidence of acute territorial
infarct. No hydrocephalus. Moderate cortical volume loss. Patchy
hypodensities in the periventricular and subcortical white matter,
likely secondary to chronic microvascular ischemic changes. Old
lacunar infarcts in the bilateral basal ganglia right greater than
left.

Vascular: Calcified plaques in the carotid siphons and distal
vertebral arteries

Skull: Normal. Negative for fracture or focal lesion.

Sinuses/Orbits: No acute finding.

Other: None.
IMPRESSION: Chronic changes as described with no acute intracranial process
identified.

## 2023-01-20 HISTORY — PX: CATARACT EXTRACTION: SUR2

## 2023-03-17 ENCOUNTER — Ambulatory Visit
Admission: RE | Admit: 2023-03-17 | Discharge: 2023-03-17 | Disposition: A | Discharge status: Home / Self Care | Payer: Medicare PPO | Source: Ambulatory Visit | Attending: Family Medicine | Admitting: Family Medicine

## 2023-03-17 ENCOUNTER — Other Ambulatory Visit: Payer: Self-pay | Admitting: Family Medicine

## 2023-03-17 DIAGNOSIS — R0600 Dyspnea, unspecified: Secondary | ICD-10-CM

## 2023-06-03 ENCOUNTER — Other Ambulatory Visit: Payer: Self-pay | Admitting: Family Medicine

## 2023-06-03 DIAGNOSIS — R42 Dizziness and giddiness: Secondary | ICD-10-CM

## 2023-06-06 ENCOUNTER — Ambulatory Visit
Admission: RE | Admit: 2023-06-06 | Discharge: 2023-06-06 | Disposition: A | Discharge status: Home / Self Care | Source: Ambulatory Visit | Attending: Family Medicine | Admitting: Family Medicine

## 2023-06-06 DIAGNOSIS — R42 Dizziness and giddiness: Secondary | ICD-10-CM

## 2023-06-06 MED ORDER — GADOPICLENOL 0.5 MMOL/ML IV SOLN
7.5000 mL | Freq: Once | INTRAVENOUS | Status: AC | PRN
  Administered 2023-06-06: 7.5 mL via INTRAVENOUS

## 2023-06-29 ENCOUNTER — Ambulatory Visit (HOSPITAL_COMMUNITY): Admitting: Physician Assistant

## 2023-07-06 ENCOUNTER — Ambulatory Visit (HOSPITAL_COMMUNITY)
Admission: RE | Admit: 2023-07-06 | Discharge: 2023-07-06 | Disposition: A | Discharge status: Home / Self Care | Source: Ambulatory Visit | Attending: Physician Assistant | Admitting: Physician Assistant

## 2023-07-06 ENCOUNTER — Encounter (HOSPITAL_COMMUNITY): Payer: Self-pay | Admitting: Physician Assistant

## 2023-07-06 VITALS — BP 116/76 | HR 97 | Ht 66.5 in | Wt 147.4 lb

## 2023-07-06 DIAGNOSIS — I4891 Unspecified atrial fibrillation: Secondary | ICD-10-CM

## 2023-07-06 DIAGNOSIS — I25119 Atherosclerotic heart disease of native coronary artery with unspecified angina pectoris: Secondary | ICD-10-CM | POA: Diagnosis not present

## 2023-07-06 DIAGNOSIS — I4821 Permanent atrial fibrillation: Secondary | ICD-10-CM | POA: Diagnosis not present

## 2023-07-06 DIAGNOSIS — D6869 Other thrombophilia: Secondary | ICD-10-CM | POA: Diagnosis not present

## 2023-07-06 NOTE — Progress Notes (Signed)
 Primary Care Physician: George Chambers, MD Primary Cardiologist: George Catchings, MD Electrophysiologist: None  Referring Physician: Dr George Fitzpatrick is a 83 y.o. male with a history of CAD s/p CABG 2014, CVA, COPD, HTN, HLD, DM, CKD, atrial fibrillation who presents for consultation in the [facility].  The patient was initially diagnosed with atrial fibrillation after his CABG in 2014. He was found to be back in afib at his visit with his primary cardiologist 08/2022. At that time, the patient was not experiencing any symptoms and rate control was pursued. However, patient was seen by his PCP 05/2023 with increasing dyspnea on exertion. He was referred to the AF clinic for evaluation. Patient is on Eliquis for stroke prevention.   Today, patient remains in afib. He states he is unaware of his arrhythmia but does report increased dyspnea on exertion over the past several months, especially walking up stairs. He denies any bleeding issues on anticoagulation.   Today, he denies symptoms of palpitations, chest pain, orthopnea, PND, lower extremity edema, dizziness, presyncope, syncope, snoring, daytime somnolence, bleeding, or neurologic sequela. The patient is tolerating medications without difficulties and is otherwise without complaint today.    Atrial Fibrillation Risk Factors:  he does not have symptoms or diagnosis of sleep apnea. he does not have a history of rheumatic fever.   Atrial Fibrillation Management history:  Previous antiarrhythmic drugs: none Previous cardioversions: none Previous ablations: none Anticoagulation history: Eliquis  ROS- All systems are reviewed and negative except as per the HPI above.  Past Medical History:  Diagnosis Date   COPD (chronic obstructive pulmonary disease) (HCC)    Diabetes mellitus without complication (HCC)    Hyperlipidemia    Hypertension     Current Outpatient Medications  Medication  Sig Dispense Refill   atorvastatin (LIPITOR) 40 MG tablet Take 40 mg by mouth daily.     carvedilol (COREG) 25 MG tablet Take 25 mg by mouth 2 (two) times daily with a meal. 1/2 twice per day     ferrous sulfate 325 (65 FE) MG tablet Take 325 mg by mouth daily with breakfast.     fluticasone -salmeterol (ADVAIR  DISKUS) 250-50 MCG/ACT AEPB Inhale 1 puff into the lungs 2 (two) times daily. Rinse mouth and throat after each use. 180 each 3   glipiZIDE (GLUCOTROL) 10 MG tablet Take 10 mg by mouth 3 (three) times daily before meals.     JARDIANCE 25 MG TABS tablet Take 25 mg by mouth every morning.     MOUNJARO 10 MG/0.5ML Pen Inject 10 mg into the skin once a week.     sitaGLIPtin (JANUVIA) 50 MG tablet Take 25 mg by mouth daily.     torsemide (DEMADEX) 5 MG tablet Take 5 mg by mouth daily.     TRELEGY ELLIPTA 200-62.5-25 MCG/ACT AEPB Inhale 1 puff into the lungs daily.     ELIQUIS 2.5 MG TABS tablet Take 2.5 mg by mouth 2 (two) times daily.     metFORMIN (GLUCOPHAGE-XR) 500 MG 24 hr tablet Take 500 mg by mouth 2 (two) times daily with a meal. (Patient not taking: Reported on 07/06/2023)     ondansetron  (ZOFRAN -ODT) 4 MG disintegrating tablet 4mg  ODT q4 hours prn nausea/vomit 20 tablet 0   No current facility-administered medications for this encounter.    Physical Exam: BP 116/76   Pulse 97   Ht 5' 6.5 (1.689 m)   Wt 66.9 kg   BMI 23.43 kg/m  GEN: Well nourished, well developed in no acute distress NECK: No JVD CARDIAC: Irregularly irregular rate and rhythm, no murmurs, rubs, gallops RESPIRATORY:  Clear to auscultation without rales, wheezing or rhonchi  ABDOMEN: Soft, non-tender, non-distended EXTREMITIES:  No edema; No deformity   Wt Readings from Last 3 Encounters:  07/06/23 66.9 kg  12/17/20 72.6 kg  03/18/15 81.2 kg     EKG today demonstrates  Afib, inferior and lateral T wave changes Vent. rate 97 BPM PR interval * ms QRS duration 106 ms QT/QTcB 340/431 ms   Echo  09/10/20 ([facility]) demonstrated  SUMMARY  Difficult to assess wall motion due to arrhythmia.  Possible LV inferior wall hypokinesis  LV ejection fraction = 50%.  Mild left ventricular hypertrophy  The left atrial size is normal.  There is trace mitral regurgitation.  There is no pericardial effusion.  There is no significant change in comparison with the last study.    CHA2DS2-VASc Score = 7  The patient's score is based upon: CHF History: 0 HTN History: 1 Diabetes History: 1 Stroke History: 2 Vascular Disease History: 1 Age Score: 2 Gender Score: 0       ASSESSMENT AND PLAN: Permanent Atrial Fibrillation (ICD10:  I48.11) The patient's CHA2DS2-VASc score is 7, indicating a 11.2% annual risk of stroke.  Patient remains in rate controlled afib today. We discussed rate vs rhythm control. I explained that afib could be contributing to his dyspnea and a trial of SR with a DCCV would be reasonable. He is clear in his decision to pursue rate control only and avoid more procedures or medications. We also discussed that the longer he is in afib the more difficult it would be to restore SR. He voices understanding.  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  Continue Apixaban (Eliquis). No bleeding issues.   CAD S/p CABG 2014 No anginal symptoms Previously followed by Dr Murray Arnold, patient interested in transitioning care to [facility], will refer to establish care with a primary cardiologist.   HTN Stable on current regimen    Follow up in the AF clinic as needed. Will refer to establish care with a primary cardiologist.       George Fitzpatrick [facility] [address] [place], [place] [address] [phone]

## 2023-09-23 ENCOUNTER — Ambulatory Visit: Admitting: Neurology

## 2023-09-23 ENCOUNTER — Telehealth: Payer: Self-pay | Admitting: Neurology

## 2023-09-23 NOTE — Telephone Encounter (Signed)
 Pt reports he will not make appointment today, he asked to cx

## 2023-10-11 NOTE — Progress Notes (Signed)
 HPI: Follow-up coronary artery disease and atrial fibrillation.  Previously followed by Dr. Alexa Counter but transitioning to me.  Patient is status post coronary artery bypass and graft in 2014.  Echocardiogram in 2022 showed ejection fraction 45 to 50%.  Nuclear study 2022 showed large inferolateral infarct but no ischemia and ejection fraction 41%.  Recently seen in the atrial fibrillation clinic and there was discussion concerning rate control versus rhythm control.  Patient elected rate control.  Since last seen patient has dyspnea on exertion.  His daughter is an Administrator, arts and is concerned about chronotropic competence.  There is no orthopnea, PND, pedal edema, exertional chest pain or syncope.  He has fallen in the past.  Current Outpatient Medications  Medication Sig Dispense Refill   atorvastatin (LIPITOR) 40 MG tablet Take 40 mg by mouth daily.     carvedilol (COREG) 25 MG tablet Take 25 mg by mouth 2 (two) times daily with a meal. 1/2 twice per day     ELIQUIS 2.5 MG TABS tablet Take 2.5 mg by mouth 2 (two) times daily.     ferrous sulfate 325 (65 FE) MG tablet Take 325 mg by mouth daily with breakfast.     fluticasone -salmeterol (ADVAIR  DISKUS) 250-50 MCG/ACT AEPB Inhale 1 puff into the lungs 2 (two) times daily. Rinse mouth and throat after each use. 180 each 3   glipiZIDE (GLUCOTROL) 10 MG tablet Take 10 mg by mouth 3 (three) times daily before meals.     JARDIANCE 25 MG TABS tablet Take 25 mg by mouth every morning.     metFORMIN (GLUCOPHAGE-XR) 500 MG 24 hr tablet Take 500 mg by mouth 2 (two) times daily with a meal.     MOUNJARO 10 MG/0.5ML Pen Inject 10 mg into the skin once a week.     ondansetron  (ZOFRAN -ODT) 4 MG disintegrating tablet 4mg  ODT q4 hours prn nausea/vomit 20 tablet 0   sitaGLIPtin (JANUVIA) 50 MG tablet Take 25 mg by mouth daily.     torsemide (DEMADEX) 5 MG tablet Take 5 mg by mouth daily.     TRELEGY ELLIPTA 200-62.5-25 MCG/ACT AEPB Inhale 1 puff into the  lungs daily.     No current facility-administered medications for this visit.     Past Medical History:  Diagnosis Date   COPD (chronic obstructive pulmonary disease) (HCC)    Diabetes mellitus without complication (HCC)    Hyperlipidemia    Hypertension     Past Surgical History:  Procedure Laterality Date   CORONARY ARTERY BYPASS GRAFT      Social History   Socioeconomic History   Marital status: Married    Spouse name: Not on file   Number of children: Not on file   Years of education: Not on file   Highest education level: Not on file  Occupational History   Not on file  Tobacco Use   Smoking status: Former    Types: Cigarettes   Smokeless tobacco: Never   Tobacco comments:    Former smoker 07/06/23  Substance and Sexual Activity   Alcohol use: Not Currently   Drug use: Never   Sexual activity: Not on file  Other Topics Concern   Not on file  Social History Narrative   Not on file   Social Drivers of Health   Financial Resource Strain: Not on file  Food Insecurity: Not on file  Transportation Needs: Not on file  Physical Activity: Not on file  Stress: Not on file  Social Connections: Not on file  Intimate Partner Violence: Not on file    History reviewed. No pertinent family history.  ROS: no fevers or chills, productive cough, hemoptysis, dysphasia, odynophagia, melena, hematochezia, dysuria, hematuria, rash, seizure activity, orthopnea, PND, pedal edema, claudication. Remaining systems are negative.  Physical Exam: Well-developed well-nourished in no acute distress.  Skin is warm and dry.  HEENT is normal.  Neck is supple.  Chest is clear to auscultation with normal expansion.  Cardiovascular exam is irregular Abdominal exam nontender or distended. No masses palpated. Extremities show no edema. neuro grossly intact   A/P  1 coronary artery disease status post coronary artery bypass and graft-continue statin.  No aspirin given need for  anticoagulation.  Patient denies exertional chest pain.  2 permanent atrial fibrillation-patient apparently has had atrial fibrillation since his bypass surgery in 2014.  Will continue carvedilol for rate control.  Continue apixaban.  Check hemoglobin and renal function.  He has fallen occasionally.  I initiated discussions concerning watchman today and he will discuss with his daughter.  His daughter was also concerned about chronotropic competence.  We will place 3-day Zio patch to evaluate heart rate with activities.  3 hyperlipidemia-continue statin.  Check lipids and liver.  4 hypertension-blood pressure controlled.  Continue present medications.  5 ischemic cardiomyopathy-we will plan repeat echocardiogram.  Redell Shallow, MD

## 2023-10-22 ENCOUNTER — Encounter: Payer: Self-pay | Admitting: Cardiology

## 2023-10-22 ENCOUNTER — Ambulatory Visit: Attending: Cardiology | Admitting: Cardiology

## 2023-10-22 VITALS — BP 132/79 | HR 85 | Ht 66.5 in | Wt 150.0 lb

## 2023-10-22 DIAGNOSIS — I4821 Permanent atrial fibrillation: Secondary | ICD-10-CM | POA: Diagnosis not present

## 2023-10-22 DIAGNOSIS — I25119 Atherosclerotic heart disease of native coronary artery with unspecified angina pectoris: Secondary | ICD-10-CM | POA: Diagnosis not present

## 2023-10-22 NOTE — Patient Instructions (Signed)
 Testing/Procedures:  Your physician has requested that you have an echocardiogram. Echocardiography is a painless test that uses sound waves to create images of your heart. It provides your doctor with information about the size and shape of your heart and how well your heart's chambers and valves are working. This procedure takes approximately one hour. There are no restrictions for this procedure. Please do NOT wear cologne, perfume, aftershave, or lotions (deodorant is allowed). Please arrive 15 minutes prior to your appointment time.  Please note: We ask at that you not bring children with you during ultrasound (echo/ vascular) testing. Due to room size and safety concerns, children are not allowed in the ultrasound rooms during exams. Our front office staff cannot provide observation of children in our lobby area while testing is being conducted. An adult accompanying a patient to their appointment will only be allowed in the ultrasound room at the discretion of the ultrasound technician under special circumstances. We apologize for any inconvenience.[facility] 1 ST FLOOR IMAGING DEPARTMENT  ZIO XT- Long Term Monitor Instructions  Your physician has requested you wear a ZIO patch monitor for 3 days.  This is a single patch monitor. [employer] supplies one patch monitor per enrollment. Additional stickers are not available. Please do not apply patch if you will be having a Nuclear Stress Test,  Echocardiogram, Cardiac CT, MRI, or Chest Xray during the period you would be wearing the  monitor. The patch cannot be worn during these tests. You cannot remove and re-apply the  ZIO XT patch monitor.  Your ZIO patch monitor will be mailed 3 day USPS to your address on file. It may take 3-5 days  to receive your monitor after you have been enrolled.  Once you have received your monitor, please review the enclosed instructions. Your monitor  has already been registered assigning a specific  monitor serial # to you.  Billing and Patient Assistance Program Information  We have supplied [employer] with any of your insurance information on file for billing purposes. [employer] offers a sliding scale Patient Assistance Program for patients that do not have  insurance, or whose insurance does not completely cover the cost of the ZIO monitor.  You must apply for the Patient Assistance Program to qualify for this discounted rate.  To apply, please call [employer] at [phone], select option 4, select option 2, ask to apply for  Patient Assistance Program. Meredeth will ask your household income, and how many people  are in your household. They will quote your out-of-pocket cost based on that information.  [employer] will also be able to set up a 65-month, interest-free payment plan if needed.  Applying the monitor   Shave hair from upper left chest.  Hold abrader disc by orange tab. Rub abrader in 40 strokes over the upper left chest as  indicated in your monitor instructions.  Clean area with 4 enclosed alcohol pads. Let dry.  Apply patch as indicated in monitor instructions. Patch will be placed under collarbone on left  side of chest with arrow pointing upward.  Rub patch adhesive wings for 2 minutes. Remove white label marked 1. Remove the white  label marked 2. Rub patch adhesive wings for 2 additional minutes.  While looking in a mirror, press and release button in center of patch. A small green light will  flash 3-4 times. This will be your only indicator that the monitor has been turned on.  Do not shower for the first 24 hours. You may  shower after the first 24 hours.  Press the button if you feel a symptom. You will hear a small click. Record Date, Time and  Symptom in the Patient Logbook.  When you are ready to remove the patch, follow instructions on the last 2 pages of Patient  Logbook. Stick patch monitor onto the last page of Patient Logbook.  Place Patient Logbook in the  blue and white box. Use locking tab on box and tape box closed  securely. The blue and white box has prepaid postage on it. Please place it in the mailbox as  soon as possible. Your physician should have your test results approximately 7 days after the  monitor has been mailed back to [facility].  Call [facility] Customer Care at [phone] if you have questions regarding  your ZIO XT patch monitor. Call them immediately if you see an orange light blinking on your  monitor.  If your monitor falls off in less than 4 days, contact our Monitor department at [phone].  If your monitor becomes loose or falls off after 4 days call [employer] at [phone] for  suggestions on securing your monitor   Follow-Up: At [facility], you and your health needs are our priority.  As part of our continuing mission to provide you with exceptional heart care, our providers are all part of one team.  This team includes your primary Cardiologist (physician) and Advanced Practice Providers or APPs (Physician Assistants and Nurse Practitioners) who all work together to provide you with the care you need, when you need it.  Your next appointment:   3 month(s)  Provider:   Redell Shallow, MD [place]

## 2023-10-25 ENCOUNTER — Ambulatory Visit: Attending: Cardiology

## 2023-10-25 DIAGNOSIS — I4821 Permanent atrial fibrillation: Secondary | ICD-10-CM

## 2023-10-25 NOTE — Progress Notes (Unsigned)
 Enrolled for [employer] to mail a ZIO XT long term holter monitor to the patients address on file.

## 2023-10-27 ENCOUNTER — Other Ambulatory Visit: Payer: Self-pay | Admitting: Cardiology

## 2023-10-27 DIAGNOSIS — I25119 Atherosclerotic heart disease of native coronary artery with unspecified angina pectoris: Secondary | ICD-10-CM

## 2023-10-27 DIAGNOSIS — I4821 Permanent atrial fibrillation: Secondary | ICD-10-CM

## 2023-11-02 ENCOUNTER — Ambulatory Visit (HOSPITAL_BASED_OUTPATIENT_CLINIC_OR_DEPARTMENT_OTHER)
Admission: RE | Admit: 2023-11-02 | Discharge: 2023-11-02 | Disposition: A | Discharge status: Home / Self Care | Source: Ambulatory Visit | Attending: Cardiology | Admitting: Cardiology

## 2023-11-02 DIAGNOSIS — I25119 Atherosclerotic heart disease of native coronary artery with unspecified angina pectoris: Secondary | ICD-10-CM | POA: Insufficient documentation

## 2023-11-02 DIAGNOSIS — I4821 Permanent atrial fibrillation: Secondary | ICD-10-CM | POA: Diagnosis present

## 2023-11-03 ENCOUNTER — Ambulatory Visit: Payer: Self-pay | Admitting: Cardiology

## 2023-11-03 LAB — ECHOCARDIOGRAM COMPLETE
AR max vel: 1.9 cm2
AV Area VTI: 1.96 cm2
AV Area mean vel: 1.9 cm2
AV Mean grad: 2.5 mmHg
AV Peak grad: 3.9 mmHg
Ao pk vel: 0.99 m/s
Area-P 1/2: 4.57 cm2
Calc EF: 42.6 %
MV M vel: 5.38 m/s
MV Peak grad: 115.8 mmHg
S' Lateral: 3.8 cm
Single Plane A2C EF: 40.3 %
Single Plane A4C EF: 43.6 %

## 2023-11-05 ENCOUNTER — Ambulatory Visit: Payer: Self-pay | Admitting: Cardiology

## 2023-11-05 DIAGNOSIS — I4821 Permanent atrial fibrillation: Secondary | ICD-10-CM

## 2023-12-28 ENCOUNTER — Encounter: Payer: Self-pay | Admitting: *Deleted

## 2024-01-03 ENCOUNTER — Telehealth: Payer: Self-pay | Admitting: Cardiology

## 2024-01-03 NOTE — Telephone Encounter (Signed)
 Pt states he does not want to give more blood work and recommends Dr. Pietro to call his PCP. Please advise.

## 2024-01-04 ENCOUNTER — Telehealth: Payer: Self-pay | Admitting: Cardiology

## 2024-01-04 NOTE — Telephone Encounter (Signed)
 Patient states that he recently had labwork done at his PCP. Dr. Windy his phone number if you need that is [phone]. CB # [phone]

## 2024-01-06 NOTE — Telephone Encounter (Signed)
 Spoke with dr blomgren's office, the only labs they drew were a bmp and tsh in November.

## 2024-01-24 ENCOUNTER — Encounter: Payer: Self-pay | Admitting: Neurology

## 2024-01-24 ENCOUNTER — Ambulatory Visit: Admitting: Neurology

## 2024-01-24 VITALS — BP 117/64 | HR 48

## 2024-01-24 DIAGNOSIS — I639 Cerebral infarction, unspecified: Secondary | ICD-10-CM

## 2024-01-24 DIAGNOSIS — R42 Dizziness and giddiness: Secondary | ICD-10-CM | POA: Diagnosis not present

## 2024-01-24 DIAGNOSIS — G3184 Mild cognitive impairment, so stated: Secondary | ICD-10-CM

## 2024-01-24 DIAGNOSIS — I482 Chronic atrial fibrillation, unspecified: Secondary | ICD-10-CM

## 2024-01-24 NOTE — Patient Instructions (Signed)
 I had a long d/w patient about his dizzy spell,silent stroke,chronic atrial fibrillation, mild cognitive impairment, risk for recurrent stroke/TIAs, personally independently reviewed imaging studies and stroke evaluation results and answered questions.Continue Eliquis (apixaban) daily  for secondary stroke prevention and maintain strict control of hypertension with blood pressure goal below 130/90, diabetes with hemoglobin A1c goal below 6.5% and lipids with LDL cholesterol goal below 70 mg/dL. I also advised the patient to eat a healthy diet with plenty of whole grains, cereals, fruits and vegetables, exercise regularly and maintain ideal body weight  Check labs, lipid profile, MRA of the brainand neck. I .also advised the patient to increase participation in cognitively activities like solving crossword puzzles, playing bridge and sudoku.  Also discussed memory compensation strategies. and followup in the future with me in 6-8 months or call earlier if needed.  Stroke Prevention Some medical conditions and behaviors can lead to a higher chance of having a stroke. You can help prevent a stroke by eating healthy, exercising, not smoking, and managing any medical conditions you have. Stroke is a leading cause of functional impairment. Primary prevention is particularly important because a majority of strokes are first-time events. Stroke changes the lives of not only those who experience a stroke but also their family and other caregivers. How can this condition affect me? A stroke is a medical emergency and should be treated right away. A stroke can lead to brain damage and can sometimes be life-threatening. If a person gets medical treatment right away, there is a better chance of surviving and recovering from a stroke. What can increase my risk? The following medical conditions may increase your risk of a stroke: Cardiovascular disease. High blood pressure (hypertension). Diabetes. High  cholesterol. Sickle cell disease. Blood clotting disorders (hypercoagulable state). Obesity. Sleep disorders (obstructive sleep apnea). Other risk factors include: Being older than age 50. Having a history of blood clots, stroke, or mini-stroke (transient ischemic attack, TIA). Genetic factors, such as race, ethnicity, or a family history of stroke. Smoking cigarettes or using other tobacco products. Taking birth control pills, especially if you also use tobacco. Heavy use of alcohol or drugs, especially cocaine and methamphetamine. Physical inactivity. What actions can I take to prevent this? Manage your health conditions High cholesterol levels. Eating a healthy diet is important for preventing high cholesterol. If cholesterol cannot be managed through diet alone, you may need to take medicines. Take any prescribed medicines to control your cholesterol as told by your health care provider. Hypertension. To reduce your risk of stroke, try to keep your blood pressure below 130/80. Eating a healthy diet and exercising regularly are important for controlling blood pressure. If these steps are not enough to manage your blood pressure, you may need to take medicines. Take any prescribed medicines to control hypertension as told by your health care provider. Ask your health care provider if you should monitor your blood pressure at home. Have your blood pressure checked every year, even if your blood pressure is normal. Blood pressure increases with age and some medical conditions. Diabetes. Eating a healthy diet and exercising regularly are important parts of managing your blood sugar (glucose). If your blood sugar cannot be managed through diet and exercise, you may need to take medicines. Take any prescribed medicines to control your diabetes as told by your health care provider. Get evaluated for obstructive sleep apnea. Talk to your health care provider about getting a sleep evaluation if  you snore a lot or have excessive sleepiness.  Make sure that any other medical conditions you have, such as atrial fibrillation or atherosclerosis, are managed. Nutrition Follow instructions from your health care provider about what to eat or drink to help manage your health condition. These instructions may include: Reducing your daily calorie intake. Limiting how much salt (sodium) you use to 1,500 milligrams (mg) each day. Using only healthy fats for cooking, such as olive oil, canola oil, or sunflower oil. Eating healthy foods. You can do this by: Choosing foods that are high in fiber, such as whole grains, and fresh fruits and vegetables. Eating at least 5 servings of fruits and vegetables a day. Try to fill one-half of your plate with fruits and vegetables at each meal. Choosing lean protein foods, such as lean cuts of meat, poultry without skin, fish, tofu, beans, and nuts. Eating low-fat dairy products. Avoiding foods that are high in sodium. This can help lower blood pressure. Avoiding foods that have saturated fat, trans fat, and cholesterol. This can help prevent high cholesterol. Avoiding processed and prepared foods. Counting your daily carbohydrate intake.  Lifestyle If you drink alcohol: Limit how much you have to: 0-1 drink a day for women who are not pregnant. 0-2 drinks a day for men. Know how much alcohol is in your drink. In the [place] one drink equals one 12 oz bottle of beer ( ), one 5 oz glass of wine ( ), or one 1 oz glass of hard liquor (44mL). Do not use any products that contain nicotine or tobacco. These products include cigarettes, chewing tobacco, and vaping devices, such as e-cigarettes. If you need help quitting, ask your health care provider. Avoid secondhand smoke. Do not use drugs. Activity  Try to stay at a healthy weight. Get at least 30 minutes of exercise on most days, such as: Fast walking. Biking. Swimming. Medicines Take  over-the-counter and prescription medicines only as told by your health care provider. Aspirin or blood thinners (antiplatelets or anticoagulants) may be recommended to reduce your risk of forming blood clots that can lead to stroke. Avoid taking birth control pills. Talk to your health care provider about the risks of taking birth control pills if: You are over 13 years old. You smoke. You get very bad headaches. You have had a blood clot. Where to find more information American Stroke Association: www.strokeassociation.org Get help right away if: You or a loved one has any symptoms of a stroke. BE FAST is an easy way to remember the main warning signs of a stroke: B - Balance. Signs are dizziness, sudden trouble walking, or loss of balance. E - Eyes. Signs are trouble seeing or a sudden change in vision. F - Face. Signs are sudden weakness or numbness of the face, or the face or eyelid drooping on one side. A - Arms. Signs are weakness or numbness in an arm. This happens suddenly and usually on one side of the body. S - Speech. Signs are sudden trouble speaking, slurred speech, or trouble understanding what people say. T - Time. Time to call emergency services. Write down what time symptoms started. You or a loved one has other signs of a stroke, such as: A sudden, severe headache with no known cause. Nausea or vomiting. Seizure. These symptoms may represent a serious problem that is an emergency. Do not wait to see if the symptoms will go away. Get medical help right away. Call your local emergency services (911 in the [place] Do not drive yourself to the hospital. Summary You can  help to prevent a stroke by eating healthy, exercising, not smoking, limiting alcohol intake, and managing any medical conditions you may have. Do not use any products that contain nicotine or tobacco. These include cigarettes, chewing tobacco, and vaping devices, such as e-cigarettes. If you need help quitting,  ask your health care provider. Remember BE FAST for warning signs of a stroke. Get help right away if you or a loved one has any of these signs. This information is not intended to replace advice given to you by your health care provider. Make sure you discuss any questions you have with your health care provider. Document Revised: 12/08/2021 Document Reviewed: 12/08/2021 Elsevier Patient Education  2024 [employer].  Memory Compensation Strategies  Use WARM strategy.  W= write it down  A= associate it  R= repeat it  M= make a mental note  2.   You can keep a Glass Blower/designer.  Use a 3-ring notebook with sections for the following: calendar, important names and phone numbers,  medications, doctors' names/phone numbers, lists/reminders, and a section to journal what you did  each day.   3.    Use a calendar to write appointments down.  4.    Write yourself a schedule for the day.  This can be placed on the calendar or in a separate section of the Memory Notebook.  Keeping a  regular schedule can help memory.  5.    Use medication organizer with sections for each day or morning/evening pills.  You may need help loading it  6.    Keep a basket, or pegboard by the door.  Place items that you need to take out with you in the basket or on the pegboard.  You may also want to  include a message board for reminders.  7.    Use sticky notes.  Place sticky notes with reminders in a place where the task is performed.  For example:  turn off the  stove placed by the stove, lock the door placed on the door at eye level,  take your medications on  the bathroom mirror or by the place where you normally take your medications.  8.    Use alarms/timers.  Use while cooking to remind yourself to check on food or as a reminder to take your medicine, or as a  reminder to make a call, or as a reminder to perform another task, etc.
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
## 2024-01-25 ENCOUNTER — Telehealth: Payer: Self-pay | Admitting: Neurology

## 2024-01-25 NOTE — Telephone Encounter (Signed)
MRI orders sent to [facility] [phone]

## 2024-01-26 LAB — LIPID PANEL
Chol/HDL Ratio: 2.8 ratio (ref 0.0–5.0)
Cholesterol, Total: 114 mg/dL (ref 100–199)
HDL: 41 mg/dL
LDL Chol Calc (NIH): 56 mg/dL (ref 0–99)
Triglycerides: 89 mg/dL (ref 0–149)
VLDL Cholesterol Cal: 17 mg/dL (ref 5–40)

## 2024-01-26 LAB — DEMENTIA PANEL
Homocysteine: 21.6 umol/L — AB (ref 0.0–21.3)
RPR Ser Ql: NONREACTIVE
TSH: 5.14 u[IU]/mL — ABNORMAL HIGH (ref 0.450–4.500)
Vitamin B-12: 273 pg/mL (ref 232–1245)

## 2024-01-26 LAB — HEMOGLOBIN A1C
Est. average glucose Bld gHb Est-mCnc: 212 mg/dL
Hgb A1c MFr Bld: 9 % — ABNORMAL HIGH (ref 4.8–5.6)

## 2024-02-03 ENCOUNTER — Ambulatory Visit: Payer: Self-pay | Admitting: Neurology

## 2024-02-03 ENCOUNTER — Other Ambulatory Visit: Payer: Self-pay | Admitting: Neurology

## 2024-02-03 MED ORDER — FOLIC ACID 1 MG PO TABS: 1.0000 mg | ORAL_TABLET | Freq: Every day | ORAL | 1 refills | Status: AC

## 2024-02-11 ENCOUNTER — Encounter: Payer: Self-pay | Admitting: Neurology

## 2024-02-24 ENCOUNTER — Other Ambulatory Visit

## 2024-04-01 ENCOUNTER — Other Ambulatory Visit

## 2025-01-22 ENCOUNTER — Ambulatory Visit: Admitting: Neurology
